# Patient Record
Sex: Female | Born: 1950
Health system: Southern US, Community
[De-identification: ages and names within clinical notes are randomized; demographics above are authoritative.]

## PROBLEM LIST (undated history)

## (undated) DIAGNOSIS — R0789 Other chest pain: Secondary | ICD-10-CM

## (undated) DIAGNOSIS — I10 Essential (primary) hypertension: Secondary | ICD-10-CM

## (undated) DIAGNOSIS — E785 Hyperlipidemia, unspecified: Secondary | ICD-10-CM

## (undated) DIAGNOSIS — R002 Palpitations: Secondary | ICD-10-CM

## (undated) DIAGNOSIS — Z6828 Body mass index (BMI) 28.0-28.9, adult: Secondary | ICD-10-CM

## (undated) DIAGNOSIS — R0989 Other specified symptoms and signs involving the circulatory and respiratory systems: Secondary | ICD-10-CM

## (undated) DIAGNOSIS — C50412 Malignant neoplasm of upper-outer quadrant of left female breast: Secondary | ICD-10-CM

## (undated) DIAGNOSIS — E041 Nontoxic single thyroid nodule: Secondary | ICD-10-CM

## (undated) DIAGNOSIS — J45909 Unspecified asthma, uncomplicated: Secondary | ICD-10-CM

## (undated) DIAGNOSIS — K760 Fatty (change of) liver, not elsewhere classified: Secondary | ICD-10-CM

## (undated) DIAGNOSIS — H9190 Unspecified hearing loss, unspecified ear: Secondary | ICD-10-CM

## (undated) HISTORY — DX: Hyperlipidemia, unspecified: E78.5

## (undated) HISTORY — DX: Other specified symptoms and signs involving the circulatory and respiratory systems: R09.89

## (undated) HISTORY — DX: Palpitations: R00.2

## (undated) HISTORY — DX: Nontoxic single thyroid nodule: E04.1

## (undated) HISTORY — DX: Unspecified asthma, uncomplicated: J45.909

## (undated) HISTORY — DX: Unspecified hearing loss, unspecified ear: H91.90

## (undated) HISTORY — DX: Malignant neoplasm of upper-outer quadrant of left female breast: C50.412

## (undated) HISTORY — PX: LEEP: SHX91

## (undated) HISTORY — DX: Essential (primary) hypertension: I10

## (undated) HISTORY — DX: Other chest pain: R07.89

## (undated) HISTORY — DX: Fatty (change of) liver, not elsewhere classified: K76.0

## (undated) HISTORY — DX: Body mass index (BMI) 28.0-28.9, adult: Z68.28

## (undated) HISTORY — PX: MASTECTOMY: SHX3

---

## 1977-11-23 HISTORY — PX: TUBAL LIGATION: SHX77

## 2013-11-23 HISTORY — PX: BREAST RECONSTRUCTION: SHX9

## 2013-12-21 DIAGNOSIS — C50412 Malignant neoplasm of upper-outer quadrant of left female breast: Secondary | ICD-10-CM | POA: Insufficient documentation

## 2016-04-02 DIAGNOSIS — C50912 Malignant neoplasm of unspecified site of left female breast: Secondary | ICD-10-CM | POA: Diagnosis not present

## 2016-12-04 DIAGNOSIS — C50912 Malignant neoplasm of unspecified site of left female breast: Secondary | ICD-10-CM | POA: Diagnosis not present

## 2016-12-04 DIAGNOSIS — Z9013 Acquired absence of bilateral breasts and nipples: Secondary | ICD-10-CM | POA: Diagnosis not present

## 2016-12-04 DIAGNOSIS — Z9221 Personal history of antineoplastic chemotherapy: Secondary | ICD-10-CM

## 2016-12-04 DIAGNOSIS — Z923 Personal history of irradiation: Secondary | ICD-10-CM

## 2016-12-04 DIAGNOSIS — Z170 Estrogen receptor positive status [ER+]: Secondary | ICD-10-CM | POA: Diagnosis not present

## 2016-12-04 DIAGNOSIS — Z79811 Long term (current) use of aromatase inhibitors: Secondary | ICD-10-CM | POA: Diagnosis not present

## 2016-12-04 DIAGNOSIS — M8580 Other specified disorders of bone density and structure, unspecified site: Secondary | ICD-10-CM

## 2017-10-22 DIAGNOSIS — Z79899 Other long term (current) drug therapy: Secondary | ICD-10-CM | POA: Diagnosis not present

## 2017-10-22 DIAGNOSIS — E785 Hyperlipidemia, unspecified: Secondary | ICD-10-CM | POA: Diagnosis not present

## 2017-10-22 DIAGNOSIS — Z1211 Encounter for screening for malignant neoplasm of colon: Secondary | ICD-10-CM | POA: Diagnosis not present

## 2017-10-22 DIAGNOSIS — E041 Nontoxic single thyroid nodule: Secondary | ICD-10-CM | POA: Diagnosis not present

## 2017-10-22 DIAGNOSIS — I10 Essential (primary) hypertension: Secondary | ICD-10-CM | POA: Diagnosis not present

## 2017-10-22 DIAGNOSIS — Z23 Encounter for immunization: Secondary | ICD-10-CM | POA: Diagnosis not present

## 2017-10-25 DIAGNOSIS — R300 Dysuria: Secondary | ICD-10-CM | POA: Diagnosis not present

## 2017-10-25 DIAGNOSIS — N3000 Acute cystitis without hematuria: Secondary | ICD-10-CM | POA: Diagnosis not present

## 2017-12-14 DIAGNOSIS — Z0000 Encounter for general adult medical examination without abnormal findings: Secondary | ICD-10-CM | POA: Diagnosis not present

## 2017-12-16 DIAGNOSIS — M8589 Other specified disorders of bone density and structure, multiple sites: Secondary | ICD-10-CM | POA: Diagnosis not present

## 2017-12-16 DIAGNOSIS — C50912 Malignant neoplasm of unspecified site of left female breast: Secondary | ICD-10-CM | POA: Diagnosis not present

## 2017-12-16 DIAGNOSIS — M8580 Other specified disorders of bone density and structure, unspecified site: Secondary | ICD-10-CM | POA: Diagnosis not present

## 2017-12-16 DIAGNOSIS — Z853 Personal history of malignant neoplasm of breast: Secondary | ICD-10-CM | POA: Diagnosis not present

## 2017-12-16 DIAGNOSIS — Z9221 Personal history of antineoplastic chemotherapy: Secondary | ICD-10-CM | POA: Diagnosis not present

## 2017-12-16 DIAGNOSIS — Z79811 Long term (current) use of aromatase inhibitors: Secondary | ICD-10-CM | POA: Diagnosis not present

## 2018-01-06 DIAGNOSIS — H25813 Combined forms of age-related cataract, bilateral: Secondary | ICD-10-CM | POA: Diagnosis not present

## 2018-01-06 DIAGNOSIS — H35033 Hypertensive retinopathy, bilateral: Secondary | ICD-10-CM | POA: Diagnosis not present

## 2018-04-19 DIAGNOSIS — M25552 Pain in left hip: Secondary | ICD-10-CM | POA: Diagnosis not present

## 2018-04-19 DIAGNOSIS — M25562 Pain in left knee: Secondary | ICD-10-CM | POA: Diagnosis not present

## 2018-04-19 DIAGNOSIS — Z6828 Body mass index (BMI) 28.0-28.9, adult: Secondary | ICD-10-CM | POA: Diagnosis not present

## 2018-04-22 DIAGNOSIS — M179 Osteoarthritis of knee, unspecified: Secondary | ICD-10-CM | POA: Diagnosis not present

## 2018-04-22 DIAGNOSIS — M1712 Unilateral primary osteoarthritis, left knee: Secondary | ICD-10-CM | POA: Diagnosis not present

## 2018-04-22 DIAGNOSIS — M25562 Pain in left knee: Secondary | ICD-10-CM | POA: Diagnosis not present

## 2018-04-22 DIAGNOSIS — Z853 Personal history of malignant neoplasm of breast: Secondary | ICD-10-CM | POA: Diagnosis not present

## 2018-05-11 DIAGNOSIS — M1612 Unilateral primary osteoarthritis, left hip: Secondary | ICD-10-CM | POA: Diagnosis not present

## 2018-05-11 DIAGNOSIS — M179 Osteoarthritis of knee, unspecified: Secondary | ICD-10-CM

## 2018-05-11 DIAGNOSIS — M25562 Pain in left knee: Secondary | ICD-10-CM | POA: Diagnosis not present

## 2018-05-11 DIAGNOSIS — M1712 Unilateral primary osteoarthritis, left knee: Secondary | ICD-10-CM | POA: Diagnosis not present

## 2018-05-11 HISTORY — DX: Osteoarthritis of knee, unspecified: M17.9

## 2018-05-31 DIAGNOSIS — M7062 Trochanteric bursitis, left hip: Secondary | ICD-10-CM | POA: Insufficient documentation

## 2018-05-31 DIAGNOSIS — M25562 Pain in left knee: Secondary | ICD-10-CM | POA: Diagnosis not present

## 2018-05-31 DIAGNOSIS — M179 Osteoarthritis of knee, unspecified: Secondary | ICD-10-CM | POA: Diagnosis not present

## 2018-05-31 HISTORY — DX: Trochanteric bursitis, left hip: M70.62

## 2018-06-15 DIAGNOSIS — Z79811 Long term (current) use of aromatase inhibitors: Secondary | ICD-10-CM | POA: Diagnosis not present

## 2018-06-15 DIAGNOSIS — Z853 Personal history of malignant neoplasm of breast: Secondary | ICD-10-CM | POA: Diagnosis not present

## 2018-06-15 DIAGNOSIS — M810 Age-related osteoporosis without current pathological fracture: Secondary | ICD-10-CM | POA: Diagnosis not present

## 2018-07-08 DIAGNOSIS — M6281 Muscle weakness (generalized): Secondary | ICD-10-CM | POA: Diagnosis not present

## 2018-07-08 DIAGNOSIS — M7062 Trochanteric bursitis, left hip: Secondary | ICD-10-CM | POA: Diagnosis not present

## 2018-07-08 DIAGNOSIS — R2689 Other abnormalities of gait and mobility: Secondary | ICD-10-CM | POA: Diagnosis not present

## 2018-07-08 DIAGNOSIS — M79652 Pain in left thigh: Secondary | ICD-10-CM | POA: Diagnosis not present

## 2018-07-18 DIAGNOSIS — M79652 Pain in left thigh: Secondary | ICD-10-CM | POA: Diagnosis not present

## 2018-07-18 DIAGNOSIS — R2689 Other abnormalities of gait and mobility: Secondary | ICD-10-CM | POA: Diagnosis not present

## 2018-07-18 DIAGNOSIS — M6281 Muscle weakness (generalized): Secondary | ICD-10-CM | POA: Diagnosis not present

## 2018-07-18 DIAGNOSIS — M7062 Trochanteric bursitis, left hip: Secondary | ICD-10-CM | POA: Diagnosis not present

## 2018-07-20 DIAGNOSIS — M7062 Trochanteric bursitis, left hip: Secondary | ICD-10-CM | POA: Diagnosis not present

## 2018-07-20 DIAGNOSIS — M6281 Muscle weakness (generalized): Secondary | ICD-10-CM | POA: Diagnosis not present

## 2018-07-20 DIAGNOSIS — R2689 Other abnormalities of gait and mobility: Secondary | ICD-10-CM | POA: Diagnosis not present

## 2018-07-20 DIAGNOSIS — M79652 Pain in left thigh: Secondary | ICD-10-CM | POA: Diagnosis not present

## 2018-07-26 DIAGNOSIS — M7062 Trochanteric bursitis, left hip: Secondary | ICD-10-CM | POA: Diagnosis not present

## 2018-07-26 DIAGNOSIS — R2689 Other abnormalities of gait and mobility: Secondary | ICD-10-CM | POA: Diagnosis not present

## 2018-07-26 DIAGNOSIS — M6281 Muscle weakness (generalized): Secondary | ICD-10-CM | POA: Diagnosis not present

## 2018-07-26 DIAGNOSIS — M79652 Pain in left thigh: Secondary | ICD-10-CM | POA: Diagnosis not present

## 2018-07-28 DIAGNOSIS — R2689 Other abnormalities of gait and mobility: Secondary | ICD-10-CM | POA: Diagnosis not present

## 2018-07-28 DIAGNOSIS — M6281 Muscle weakness (generalized): Secondary | ICD-10-CM | POA: Diagnosis not present

## 2018-07-28 DIAGNOSIS — M7062 Trochanteric bursitis, left hip: Secondary | ICD-10-CM | POA: Diagnosis not present

## 2018-07-28 DIAGNOSIS — M79652 Pain in left thigh: Secondary | ICD-10-CM | POA: Diagnosis not present

## 2018-08-03 DIAGNOSIS — M79652 Pain in left thigh: Secondary | ICD-10-CM | POA: Diagnosis not present

## 2018-08-03 DIAGNOSIS — M6281 Muscle weakness (generalized): Secondary | ICD-10-CM | POA: Diagnosis not present

## 2018-08-03 DIAGNOSIS — M7062 Trochanteric bursitis, left hip: Secondary | ICD-10-CM | POA: Diagnosis not present

## 2018-08-03 DIAGNOSIS — R2689 Other abnormalities of gait and mobility: Secondary | ICD-10-CM | POA: Diagnosis not present

## 2018-08-05 DIAGNOSIS — M79652 Pain in left thigh: Secondary | ICD-10-CM | POA: Diagnosis not present

## 2018-08-05 DIAGNOSIS — M7062 Trochanteric bursitis, left hip: Secondary | ICD-10-CM | POA: Diagnosis not present

## 2018-08-05 DIAGNOSIS — R2689 Other abnormalities of gait and mobility: Secondary | ICD-10-CM | POA: Diagnosis not present

## 2018-08-05 DIAGNOSIS — M6281 Muscle weakness (generalized): Secondary | ICD-10-CM | POA: Diagnosis not present

## 2018-08-08 DIAGNOSIS — M6281 Muscle weakness (generalized): Secondary | ICD-10-CM | POA: Diagnosis not present

## 2018-08-08 DIAGNOSIS — M79652 Pain in left thigh: Secondary | ICD-10-CM | POA: Diagnosis not present

## 2018-08-08 DIAGNOSIS — R2689 Other abnormalities of gait and mobility: Secondary | ICD-10-CM | POA: Diagnosis not present

## 2018-08-08 DIAGNOSIS — M7062 Trochanteric bursitis, left hip: Secondary | ICD-10-CM | POA: Diagnosis not present

## 2018-08-10 DIAGNOSIS — M79652 Pain in left thigh: Secondary | ICD-10-CM | POA: Diagnosis not present

## 2018-08-10 DIAGNOSIS — M7062 Trochanteric bursitis, left hip: Secondary | ICD-10-CM | POA: Diagnosis not present

## 2018-08-10 DIAGNOSIS — M6281 Muscle weakness (generalized): Secondary | ICD-10-CM | POA: Diagnosis not present

## 2018-08-10 DIAGNOSIS — R2689 Other abnormalities of gait and mobility: Secondary | ICD-10-CM | POA: Diagnosis not present

## 2018-08-15 DIAGNOSIS — M7062 Trochanteric bursitis, left hip: Secondary | ICD-10-CM | POA: Diagnosis not present

## 2018-08-15 DIAGNOSIS — M6281 Muscle weakness (generalized): Secondary | ICD-10-CM | POA: Diagnosis not present

## 2018-08-15 DIAGNOSIS — R2689 Other abnormalities of gait and mobility: Secondary | ICD-10-CM | POA: Diagnosis not present

## 2018-08-15 DIAGNOSIS — M79652 Pain in left thigh: Secondary | ICD-10-CM | POA: Diagnosis not present

## 2018-08-17 DIAGNOSIS — R2689 Other abnormalities of gait and mobility: Secondary | ICD-10-CM | POA: Diagnosis not present

## 2018-08-17 DIAGNOSIS — M7062 Trochanteric bursitis, left hip: Secondary | ICD-10-CM | POA: Diagnosis not present

## 2018-08-17 DIAGNOSIS — M6281 Muscle weakness (generalized): Secondary | ICD-10-CM | POA: Diagnosis not present

## 2018-08-17 DIAGNOSIS — M79652 Pain in left thigh: Secondary | ICD-10-CM | POA: Diagnosis not present

## 2018-08-22 DIAGNOSIS — M6281 Muscle weakness (generalized): Secondary | ICD-10-CM | POA: Diagnosis not present

## 2018-08-22 DIAGNOSIS — M7062 Trochanteric bursitis, left hip: Secondary | ICD-10-CM | POA: Diagnosis not present

## 2018-08-22 DIAGNOSIS — R2689 Other abnormalities of gait and mobility: Secondary | ICD-10-CM | POA: Diagnosis not present

## 2018-08-22 DIAGNOSIS — M79652 Pain in left thigh: Secondary | ICD-10-CM | POA: Diagnosis not present

## 2018-09-05 DIAGNOSIS — M8589 Other specified disorders of bone density and structure, multiple sites: Secondary | ICD-10-CM | POA: Diagnosis not present

## 2018-09-05 DIAGNOSIS — M85852 Other specified disorders of bone density and structure, left thigh: Secondary | ICD-10-CM | POA: Diagnosis not present

## 2018-09-05 DIAGNOSIS — M85851 Other specified disorders of bone density and structure, right thigh: Secondary | ICD-10-CM | POA: Diagnosis not present

## 2018-10-24 DIAGNOSIS — Z6828 Body mass index (BMI) 28.0-28.9, adult: Secondary | ICD-10-CM | POA: Diagnosis not present

## 2018-10-24 DIAGNOSIS — Z79899 Other long term (current) drug therapy: Secondary | ICD-10-CM | POA: Diagnosis not present

## 2018-10-24 DIAGNOSIS — Z23 Encounter for immunization: Secondary | ICD-10-CM | POA: Diagnosis not present

## 2018-10-24 DIAGNOSIS — I10 Essential (primary) hypertension: Secondary | ICD-10-CM | POA: Diagnosis not present

## 2018-10-24 DIAGNOSIS — E785 Hyperlipidemia, unspecified: Secondary | ICD-10-CM | POA: Diagnosis not present

## 2018-12-02 DIAGNOSIS — J209 Acute bronchitis, unspecified: Secondary | ICD-10-CM | POA: Diagnosis not present

## 2018-12-07 DIAGNOSIS — J40 Bronchitis, not specified as acute or chronic: Secondary | ICD-10-CM | POA: Diagnosis not present

## 2018-12-12 DIAGNOSIS — M8580 Other specified disorders of bone density and structure, unspecified site: Secondary | ICD-10-CM | POA: Diagnosis not present

## 2018-12-12 DIAGNOSIS — Z853 Personal history of malignant neoplasm of breast: Secondary | ICD-10-CM | POA: Diagnosis not present

## 2018-12-12 DIAGNOSIS — C50912 Malignant neoplasm of unspecified site of left female breast: Secondary | ICD-10-CM | POA: Diagnosis not present

## 2018-12-12 DIAGNOSIS — Z79811 Long term (current) use of aromatase inhibitors: Secondary | ICD-10-CM | POA: Diagnosis not present

## 2018-12-12 DIAGNOSIS — N6320 Unspecified lump in the left breast, unspecified quadrant: Secondary | ICD-10-CM | POA: Diagnosis not present

## 2018-12-16 DIAGNOSIS — M8589 Other specified disorders of bone density and structure, multiple sites: Secondary | ICD-10-CM | POA: Diagnosis not present

## 2018-12-23 DIAGNOSIS — N649 Disorder of breast, unspecified: Secondary | ICD-10-CM | POA: Diagnosis not present

## 2018-12-23 DIAGNOSIS — Z853 Personal history of malignant neoplasm of breast: Secondary | ICD-10-CM | POA: Diagnosis not present

## 2018-12-23 DIAGNOSIS — R928 Other abnormal and inconclusive findings on diagnostic imaging of breast: Secondary | ICD-10-CM | POA: Diagnosis not present

## 2018-12-23 DIAGNOSIS — N6489 Other specified disorders of breast: Secondary | ICD-10-CM | POA: Diagnosis not present

## 2019-01-02 DIAGNOSIS — Z6828 Body mass index (BMI) 28.0-28.9, adult: Secondary | ICD-10-CM | POA: Diagnosis not present

## 2019-01-02 DIAGNOSIS — J329 Chronic sinusitis, unspecified: Secondary | ICD-10-CM | POA: Diagnosis not present

## 2019-01-02 DIAGNOSIS — J40 Bronchitis, not specified as acute or chronic: Secondary | ICD-10-CM | POA: Diagnosis not present

## 2019-06-07 DIAGNOSIS — M8589 Other specified disorders of bone density and structure, multiple sites: Secondary | ICD-10-CM | POA: Diagnosis not present

## 2019-06-07 DIAGNOSIS — Z853 Personal history of malignant neoplasm of breast: Secondary | ICD-10-CM | POA: Diagnosis not present

## 2019-06-07 DIAGNOSIS — Z79811 Long term (current) use of aromatase inhibitors: Secondary | ICD-10-CM | POA: Diagnosis not present

## 2019-06-23 ENCOUNTER — Other Ambulatory Visit: Payer: Self-pay

## 2019-08-02 DIAGNOSIS — H9190 Unspecified hearing loss, unspecified ear: Secondary | ICD-10-CM | POA: Diagnosis not present

## 2019-08-02 DIAGNOSIS — Z23 Encounter for immunization: Secondary | ICD-10-CM | POA: Diagnosis not present

## 2019-08-02 DIAGNOSIS — Z6828 Body mass index (BMI) 28.0-28.9, adult: Secondary | ICD-10-CM | POA: Diagnosis not present

## 2019-08-24 DIAGNOSIS — Z77122 Contact with and (suspected) exposure to noise: Secondary | ICD-10-CM | POA: Diagnosis not present

## 2019-08-24 DIAGNOSIS — H9190 Unspecified hearing loss, unspecified ear: Secondary | ICD-10-CM | POA: Diagnosis not present

## 2019-08-24 DIAGNOSIS — H938X9 Other specified disorders of ear, unspecified ear: Secondary | ICD-10-CM | POA: Diagnosis not present

## 2019-08-24 DIAGNOSIS — H903 Sensorineural hearing loss, bilateral: Secondary | ICD-10-CM | POA: Diagnosis not present

## 2019-09-06 DIAGNOSIS — Z6828 Body mass index (BMI) 28.0-28.9, adult: Secondary | ICD-10-CM | POA: Diagnosis not present

## 2019-09-06 DIAGNOSIS — E785 Hyperlipidemia, unspecified: Secondary | ICD-10-CM | POA: Diagnosis not present

## 2019-09-06 DIAGNOSIS — Z79899 Other long term (current) drug therapy: Secondary | ICD-10-CM | POA: Diagnosis not present

## 2019-09-06 DIAGNOSIS — Z0000 Encounter for general adult medical examination without abnormal findings: Secondary | ICD-10-CM | POA: Diagnosis not present

## 2019-09-06 DIAGNOSIS — Z1331 Encounter for screening for depression: Secondary | ICD-10-CM | POA: Diagnosis not present

## 2019-09-27 DIAGNOSIS — H903 Sensorineural hearing loss, bilateral: Secondary | ICD-10-CM | POA: Diagnosis not present

## 2019-10-05 DIAGNOSIS — H903 Sensorineural hearing loss, bilateral: Secondary | ICD-10-CM | POA: Diagnosis not present

## 2019-11-29 DIAGNOSIS — R300 Dysuria: Secondary | ICD-10-CM | POA: Diagnosis not present

## 2019-12-04 DIAGNOSIS — M8589 Other specified disorders of bone density and structure, multiple sites: Secondary | ICD-10-CM | POA: Diagnosis not present

## 2019-12-04 DIAGNOSIS — Z853 Personal history of malignant neoplasm of breast: Secondary | ICD-10-CM | POA: Diagnosis not present

## 2019-12-04 DIAGNOSIS — C50412 Malignant neoplasm of upper-outer quadrant of left female breast: Secondary | ICD-10-CM

## 2019-12-05 DIAGNOSIS — R200 Anesthesia of skin: Secondary | ICD-10-CM | POA: Diagnosis not present

## 2020-03-27 DIAGNOSIS — R300 Dysuria: Secondary | ICD-10-CM | POA: Diagnosis not present

## 2020-03-27 DIAGNOSIS — M5432 Sciatica, left side: Secondary | ICD-10-CM | POA: Diagnosis not present

## 2020-03-27 DIAGNOSIS — Z6828 Body mass index (BMI) 28.0-28.9, adult: Secondary | ICD-10-CM | POA: Diagnosis not present

## 2020-04-29 DIAGNOSIS — H9193 Unspecified hearing loss, bilateral: Secondary | ICD-10-CM | POA: Diagnosis not present

## 2020-04-29 DIAGNOSIS — Z974 Presence of external hearing-aid: Secondary | ICD-10-CM | POA: Diagnosis not present

## 2020-04-29 DIAGNOSIS — S00411A Abrasion of right ear, initial encounter: Secondary | ICD-10-CM | POA: Diagnosis not present

## 2020-05-24 DIAGNOSIS — R001 Bradycardia, unspecified: Secondary | ICD-10-CM | POA: Diagnosis not present

## 2020-05-24 DIAGNOSIS — Z853 Personal history of malignant neoplasm of breast: Secondary | ICD-10-CM | POA: Diagnosis not present

## 2020-05-24 DIAGNOSIS — M8589 Other specified disorders of bone density and structure, multiple sites: Secondary | ICD-10-CM | POA: Diagnosis not present

## 2020-05-24 DIAGNOSIS — I10 Essential (primary) hypertension: Secondary | ICD-10-CM | POA: Diagnosis not present

## 2020-05-30 DIAGNOSIS — H25813 Combined forms of age-related cataract, bilateral: Secondary | ICD-10-CM | POA: Diagnosis not present

## 2020-05-30 DIAGNOSIS — H35033 Hypertensive retinopathy, bilateral: Secondary | ICD-10-CM | POA: Diagnosis not present

## 2020-09-02 ENCOUNTER — Encounter: Payer: Self-pay | Admitting: Hematology and Oncology

## 2020-09-06 DIAGNOSIS — M8589 Other specified disorders of bone density and structure, multiple sites: Secondary | ICD-10-CM | POA: Diagnosis not present

## 2020-09-09 DIAGNOSIS — E041 Nontoxic single thyroid nodule: Secondary | ICD-10-CM | POA: Diagnosis not present

## 2020-09-09 DIAGNOSIS — Z79899 Other long term (current) drug therapy: Secondary | ICD-10-CM | POA: Diagnosis not present

## 2020-09-09 DIAGNOSIS — Z23 Encounter for immunization: Secondary | ICD-10-CM | POA: Diagnosis not present

## 2020-09-09 DIAGNOSIS — E785 Hyperlipidemia, unspecified: Secondary | ICD-10-CM | POA: Diagnosis not present

## 2020-09-09 DIAGNOSIS — I10 Essential (primary) hypertension: Secondary | ICD-10-CM | POA: Diagnosis not present

## 2020-09-09 DIAGNOSIS — Z6828 Body mass index (BMI) 28.0-28.9, adult: Secondary | ICD-10-CM | POA: Diagnosis not present

## 2020-10-29 ENCOUNTER — Other Ambulatory Visit: Payer: Self-pay | Admitting: Hematology and Oncology

## 2020-10-29 DIAGNOSIS — Z170 Estrogen receptor positive status [ER+]: Secondary | ICD-10-CM

## 2020-10-29 DIAGNOSIS — C50412 Malignant neoplasm of upper-outer quadrant of left female breast: Secondary | ICD-10-CM

## 2020-11-01 ENCOUNTER — Other Ambulatory Visit: Payer: Self-pay | Admitting: Pharmacist

## 2020-11-01 ENCOUNTER — Encounter: Payer: Self-pay | Admitting: Pharmacist

## 2020-11-01 DIAGNOSIS — M8589 Other specified disorders of bone density and structure, multiple sites: Secondary | ICD-10-CM

## 2020-11-01 HISTORY — DX: Other specified disorders of bone density and structure, multiple sites: M85.89

## 2020-11-05 NOTE — Progress Notes (Signed)
PT STABLE AT TIME OF DISCHARGE 

## 2020-11-12 ENCOUNTER — Other Ambulatory Visit: Payer: Self-pay | Admitting: Hematology and Oncology

## 2020-11-12 ENCOUNTER — Inpatient Hospital Stay: Payer: Medicare Other | Attending: Oncology

## 2020-11-12 ENCOUNTER — Other Ambulatory Visit: Payer: Self-pay

## 2020-11-12 ENCOUNTER — Telehealth: Payer: Self-pay | Admitting: Oncology

## 2020-11-12 DIAGNOSIS — D649 Anemia, unspecified: Secondary | ICD-10-CM | POA: Diagnosis not present

## 2020-11-12 DIAGNOSIS — Z0001 Encounter for general adult medical examination with abnormal findings: Secondary | ICD-10-CM | POA: Diagnosis not present

## 2020-11-12 LAB — COMPREHENSIVE METABOLIC PANEL
Albumin: 4.4 (ref 3.5–5.0)
Calcium: 10.1 (ref 8.7–10.7)

## 2020-11-12 LAB — CBC AND DIFFERENTIAL
HCT: 41 (ref 36–46)
Hemoglobin: 13.8 (ref 12.0–16.0)
Neutrophils Absolute: 3.67
Platelets: 265 (ref 150–399)
WBC: 6.8

## 2020-11-12 LAB — HEPATIC FUNCTION PANEL
ALT: 20 (ref 7–35)
AST: 25 (ref 13–35)
Alkaline Phosphatase: 66 (ref 25–125)
Bilirubin, Total: 0.5

## 2020-11-12 LAB — BASIC METABOLIC PANEL
BUN: 17 (ref 4–21)
CO2: 28 — AB (ref 13–22)
Chloride: 105 (ref 99–108)
Creatinine: 0.7 (ref 0.5–1.1)
Glucose: 95
Potassium: 4 (ref 3.4–5.3)
Sodium: 139 (ref 137–147)

## 2020-11-12 LAB — CBC: RBC: 4.67 (ref 3.87–5.11)

## 2020-11-12 NOTE — Telephone Encounter (Signed)
Patient requested to be rescheduled for Follow Up and Prolia Injection on 11/26/2020 Follow Up at 11:00 am - Prolia Injection 1:00 pm

## 2020-11-14 ENCOUNTER — Inpatient Hospital Stay: Payer: Medicare Other

## 2020-11-18 ENCOUNTER — Ambulatory Visit: Payer: Medicare Other | Admitting: Oncology

## 2020-11-18 ENCOUNTER — Other Ambulatory Visit: Payer: Medicare Other

## 2020-11-25 NOTE — Progress Notes (Signed)
Sarah Hubbard  [address] [place],  [place]  [address] [phone]  Clinic Day:  11/26/2020  Referring physician: Ronita Hipps, MD   This document serves as a record of services personally performed by Sarah Poisson, MD. It was created on their behalf by [facility], a trained medical scribe. The creation of this record is based on the scribe's personal observations and the provider's statements to them.   CHIEF COMPLAINT:  CC: History of stage IIB hormone receptor positive breast cancer  Current Treatment:  Surveillance    HISTORY OF PRESENT ILLNESS:  Sarah Hubbard is a 70 y.o. 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 We began seeing her in March 2016, when she relocated to the area.  She was treated with bilateral mastectomies and reconstruction in [place].  Pathology revealed 2 lesions in the left breast measuring 2.5 cm and 1.1 cm respectively, which were grade 2, invasive lobular carcinoma. 1 of 4 lymph nodes were positive.  Pathology of the right breast was benign.  Estrogen receptors were positive, progesterone receptors negative and her 2 Neu negative.  She had surgical wound dehiscence of the reconstruction and abdominal wounds, but eventually healed.  She received adjuvant chemotherapy with dose dense doxorubicin and cyclophosphamide for 4 cycles, followed by dose dense paclitaxel for 4 cycles.  Chemotherapy was completed in August 2015.  She received adjuvant radiation to the left reconstruction and axilla completed in October 2015.  She was then placed on anastrozole 1 mg daily in November 2015.  Due to her personal and family history she states she saw a genetic counselor regarding testing for hereditary breast and ovarian cancer.  Her mother had breast cancer in her 56's and a maternal grandmother had lung cancer.  As the patient's daughter had already  undergone testing for hereditary breast and ovarian cancer, which was negative, the patient did not wish to have testing.  Bone density scan was in October 2017 and revealed osteopenia, with a T-score of -1.8 of the femur and -0.7 of the spine.  The bone density had previously been normal with a T-score of -0.6 of the femur.  Due to the osteopenia while on anastrozole, she was instructed to take calcium/vitamin-D twice daily and placed on Prolia injections every 6 months in January 2018.  She had removal of polyps on previous colonoscopy, so Dr. Helene Kelp had her see Dr. Melina Copa.  He recommended repeat colonoscopy in 5 years.  Bone density scan in October 2019 revealed improvement in her osteopenia with a T-score of -0.9 in the spine and a T-score -1.6 in the femur.  At her visit in January 2020, she was concerned about a tender lump under her left breast reconstruction.  There was some firmness of the inferior left reconstruction.  She therefore underwent a left diagnostic mammogram and ultrasound, which did not reveal any abnormality.  She stopped her anastrozole in January 2021.    INTERVAL HISTORY:  Sarah Hubbard is here for routine follow up and states that she has been well.  Bone density scan from October revealed osteopenia with a T-score of -1.6 of the right femur neck, stable.  Dual femur total mean is normal at -1.0, previously -1.1.  AP spine is normal at -0.5, previously -0.9.  With this good of improvement, we will discontinue Prolia after her dose today.  I did advise that she continue with oral calcium and  vitamin D.  Blood counts and chemistries from December 21st were unremarkable.  Her  appetite is good, and she has gained 2 pounds since her last visit.  She denies fever, chills or other signs of infection.  She denies nausea, vomiting, bowel issues, or abdominal pain.  She denies sore throat, cough, dyspnea, or chest pain.  REVIEW OF SYSTEMS:  Review of Systems  Constitutional: Negative.   HENT:   Negative.   Eyes: Negative.   Respiratory: Negative.   Cardiovascular: Negative.   Gastrointestinal: Negative.   Endocrine: Negative.   Genitourinary: Negative.    Musculoskeletal: Negative.   Skin: Negative.   Neurological: Negative.   Hematological: Negative.   Psychiatric/Behavioral: Negative.      VITALS:  Blood pressure (!) 152/71, pulse (!) 52, temperature (!) 97.4 F (36.3 C), temperature source Oral, resp. rate 18, height 5' (1.524 m), weight 158 lb 11.2 oz (72 kg), SpO2 98 %.  Wt Readings from Last 3 Encounters:  11/26/20 158 lb 11.2 oz (72 kg)  05/24/20 156 lb 9 oz (71 kg)    Body mass index is 30.99 kg/m.  Performance status (ECOG): 1 - Symptomatic but completely ambulatory  PHYSICAL EXAM:  Physical Exam Constitutional:      General: She is not in acute distress.    Appearance: Normal appearance. She is normal weight.  HENT:     Head: Normocephalic and atraumatic.  Eyes:     General: No scleral icterus.    Extraocular Movements: Extraocular movements intact.     Conjunctiva/sclera: Conjunctivae normal.     Pupils: Pupils are equal, round, and reactive to light.  Cardiovascular:     Rate and Rhythm: Regular rhythm. Bradycardia present.     Pulses: Normal pulses.     Heart sounds: Normal heart sounds. No murmur heard. No friction rub. No gallop.   Pulmonary:     Effort: Pulmonary effort is normal. No respiratory distress.     Breath sounds: Normal breath sounds.  Chest:  Breasts:     Right: Normal.     Left: Normal.      Comments: Bilateral reconstructions are negative. Abdominal:     General: Bowel sounds are normal. There is no distension.     Palpations: Abdomen is soft. There is no mass.     Tenderness: There is no abdominal tenderness.  Musculoskeletal:        General: Normal range of motion.     Cervical back: Normal range of motion and neck supple.     Right lower leg: No edema.     Left lower leg: No edema.  Lymphadenopathy:     Cervical:  No cervical adenopathy.  Skin:    General: Skin is warm and dry.  Neurological:     General: No focal deficit present.     Mental Status: She is alert and oriented to person, place, and time. Mental status is at baseline.  Psychiatric:        Mood and Affect: Mood normal.        Behavior: Behavior normal.        Thought Content: Thought content normal.        Judgment: Judgment normal.     LABS:   CBC Latest Ref Rng & Units 11/12/2020  WBC - 6.8  Hemoglobin 12.0 - 16.0 13.8  Hematocrit 36 - 46 41  Platelets 150 - 399 265   CMP Latest Ref Rng & Units 11/12/2020  BUN 4 - 21 17  Creatinine  0.5 - 1.1 0.7  Sodium 137 - 147 139  Potassium 3.4 - 5.3 4.0  Chloride 99 - 108 105  CO2 13 - 22 28(A)  Calcium 8.7 - 10.7 10.1  Alkaline Phos 25 - 125 66  AST 13 - 35 25  ALT 7 - 35 20    STUDIES:   She underwent a DXA for bone mineral density on 09/06/2020 showing osteopenia with a T-score of -1.6 of the right femur neck, stable.  Dual femur total mean is normal at -1.0, previously -1.1.  AP spine is normal at -0.5, previously -0.9.    Allergies:  Allergies  Allergen Reactions  . Antihistamines, Diphenhydramine-Type     Current Medications: Current Outpatient Medications  Medication Sig Dispense Refill  . lisinopril (ZESTRIL) 5 MG tablet     . aspirin EC 81 MG tablet Take 81 mg by mouth daily. Swallow whole.    . calcium-vitamin D (OSCAL WITH D) 500-200 MG-UNIT tablet Take 1 tablet by mouth.    . denosumab (PROLIA) 60 MG/ML SOSY injection Inject 60 mg into the skin every 6 (six) months.    Marland Kitchen losartan (COZAAR) 25 MG tablet Take 25 mg by mouth daily.    . Omega-3 1000 MG CAPS Take by mouth.    . omega-3 acid ethyl esters (LOVAZA) 1 g capsule Take by mouth 2 (two) times daily.    . potassium chloride (KLOR-CON) 8 MEQ tablet Take 8 mEq by mouth daily.    . pravastatin (PRAVACHOL) 10 MG tablet Take 10 mg by mouth daily.     No current facility-administered medications for this  visit.     ASSESSMENT & PLAN:   Assessment:   1. History of IIB hormone receptor positive breast cancer.  She remains without evidence of recurrence.  She completed 5+ years of anastrozole in January 2021.  2. Osteopenia, for which she is on Prolia every 6 months, in addition to calcium and vitamin-D.  Recent bone density shows good improvement so we will discontinue after her dose today.  She knows to continue with daily calcium and vitamin D.  She will be due for repeat bone density in October 99991111.  3. Systolic hypertension, mild.    4. Mild stable bradycardia.  The patient is asymptomatic.   Plan: We will proceed with Prolia today then discontinue this as most recent bone density shows good improvement and she in now off the aromatase inhibitor.  She knows to continue oral calcium and vitamin D.  We will plan to see her back in 1 year for repeat evaluation.  We will ask Dr. Helene Kelp to include a CBC and CMP with her annual blood work, and we will send a copy of the patients bone density to her office.  The patient understands the plans discussed today and is in agreement with them.  She knows to contact our office if she develops concerns regarding her breast cancer or its treatment.   I provided 20 minutes of face-to-face time during this this encounter and > 50% was spent counseling as documented under my assessment and plan.    Derwood Kaplan, MD [facility] AT [facility] [address] [place] [address] Dept: [phone] Dept Fax: [phone]   I, Rita Ohara, am acting as scribe for Derwood Kaplan, MD  I have reviewed this report as typed by the medical scribe, and it is complete and accurate.

## 2020-11-26 ENCOUNTER — Other Ambulatory Visit: Payer: Self-pay

## 2020-11-26 ENCOUNTER — Ambulatory Visit: Payer: Medicare Other

## 2020-11-26 ENCOUNTER — Other Ambulatory Visit: Payer: Self-pay | Admitting: Pharmacist

## 2020-11-26 ENCOUNTER — Inpatient Hospital Stay: Payer: Medicare Other

## 2020-11-26 ENCOUNTER — Encounter: Payer: Self-pay | Admitting: Oncology

## 2020-11-26 ENCOUNTER — Inpatient Hospital Stay: Payer: Medicare Other | Attending: Oncology | Admitting: Oncology

## 2020-11-26 VITALS — BP 146/58 | HR 58 | Temp 97.9°F | Resp 18 | Ht 60.0 in | Wt 160.2 lb

## 2020-11-26 VITALS — BP 152/71 | HR 52 | Temp 97.4°F | Resp 18 | Ht 60.0 in | Wt 158.7 lb

## 2020-11-26 DIAGNOSIS — Z923 Personal history of irradiation: Secondary | ICD-10-CM | POA: Insufficient documentation

## 2020-11-26 DIAGNOSIS — R001 Bradycardia, unspecified: Secondary | ICD-10-CM | POA: Insufficient documentation

## 2020-11-26 DIAGNOSIS — C50412 Malignant neoplasm of upper-outer quadrant of left female breast: Secondary | ICD-10-CM | POA: Diagnosis not present

## 2020-11-26 DIAGNOSIS — Z79899 Other long term (current) drug therapy: Secondary | ICD-10-CM | POA: Diagnosis not present

## 2020-11-26 DIAGNOSIS — Z7983 Long term (current) use of bisphosphonates: Secondary | ICD-10-CM | POA: Diagnosis not present

## 2020-11-26 DIAGNOSIS — Z170 Estrogen receptor positive status [ER+]: Secondary | ICD-10-CM | POA: Diagnosis not present

## 2020-11-26 DIAGNOSIS — M8589 Other specified disorders of bone density and structure, multiple sites: Secondary | ICD-10-CM

## 2020-11-26 DIAGNOSIS — C50912 Malignant neoplasm of unspecified site of left female breast: Secondary | ICD-10-CM | POA: Diagnosis not present

## 2020-11-26 DIAGNOSIS — M85851 Other specified disorders of bone density and structure, right thigh: Secondary | ICD-10-CM | POA: Diagnosis not present

## 2020-11-26 DIAGNOSIS — Z79811 Long term (current) use of aromatase inhibitors: Secondary | ICD-10-CM | POA: Insufficient documentation

## 2020-11-26 DIAGNOSIS — I10 Essential (primary) hypertension: Secondary | ICD-10-CM | POA: Diagnosis not present

## 2020-11-26 MED ORDER — DENOSUMAB 60 MG/ML ~~LOC~~ SOSY
60.0000 mg | PREFILLED_SYRINGE | Freq: Once | SUBCUTANEOUS | Status: AC
  Administered 2020-11-26: 60 mg via SUBCUTANEOUS

## 2020-11-26 MED ORDER — DENOSUMAB 60 MG/ML ~~LOC~~ SOSY
PREFILLED_SYRINGE | SUBCUTANEOUS | Status: AC
  Filled 2020-11-26: qty 1

## 2020-11-26 NOTE — Patient Instructions (Signed)
Denosumab injection What is this medicine? DENOSUMAB (den oh sue mab) slows bone breakdown. Prolia is used to treat osteoporosis in women after menopause and in men, and in people who are taking corticosteroids for 6 months or more. Xgeva is used to treat a high calcium level due to cancer and to prevent bone fractures and other bone problems caused by multiple myeloma or cancer bone metastases. Xgeva is also used to treat giant cell tumor of the bone. This medicine may be used for other purposes; ask your health care provider or pharmacist if you have questions. COMMON BRAND NAME(S): Prolia, XGEVA What should I tell my health care provider before I take this medicine? They need to know if you have any of these conditions:  dental disease  having surgery or tooth extraction  infection  kidney disease  low levels of calcium or Vitamin D in the blood  malnutrition  on hemodialysis  skin conditions or sensitivity  thyroid or parathyroid disease  an unusual reaction to denosumab, other medicines, foods, dyes, or preservatives  pregnant or trying to get pregnant  breast-feeding How should I use this medicine? This medicine is for injection under the skin. It is given by a health care professional in a hospital or clinic setting. A special MedGuide will be given to you before each treatment. Be sure to read this information carefully each time. For Prolia, talk to your pediatrician regarding the use of this medicine in children. Special care may be needed. For Xgeva, talk to your pediatrician regarding the use of this medicine in children. While this drug may be prescribed for children as young as 13 years for selected conditions, precautions do apply. Overdosage: If you think you have taken too much of this medicine contact a poison control center or emergency room at once. NOTE: This medicine is only for you. Do not share this medicine with others. What if I miss a dose? It is  important not to miss your dose. Call your doctor or health care professional if you are unable to keep an appointment. What may interact with this medicine? Do not take this medicine with any of the following medications:  other medicines containing denosumab This medicine may also interact with the following medications:  medicines that lower your chance of fighting infection  steroid medicines like prednisone or cortisone This list may not describe all possible interactions. Give your health care provider a list of all the medicines, herbs, non-prescription drugs, or dietary supplements you use. Also tell them if you smoke, drink alcohol, or use illegal drugs. Some items may interact with your medicine. What should I watch for while using this medicine? Visit your doctor or health care professional for regular checks on your progress. Your doctor or health care professional may order blood tests and other tests to see how you are doing. Call your doctor or health care professional for advice if you get a fever, chills or sore throat, or other symptoms of a cold or flu. Do not treat yourself. This drug may decrease your body's ability to fight infection. Try to avoid being around people who are sick. You should make sure you get enough calcium and vitamin D while you are taking this medicine, unless your doctor tells you not to. Discuss the foods you eat and the vitamins you take with your health care professional. See your dentist regularly. Brush and floss your teeth as directed. Before you have any dental work done, tell your dentist you are   receiving this medicine. Do not become pregnant while taking this medicine or for 5 months after stopping it. Talk with your doctor or health care professional about your birth control options while taking this medicine. Women should inform their doctor if they wish to become pregnant or think they might be pregnant. There is a potential for serious side  effects to an unborn child. Talk to your health care professional or pharmacist for more information. What side effects may I notice from receiving this medicine? Side effects that you should report to your doctor or health care professional as soon as possible:  allergic reactions like skin rash, itching or hives, swelling of the face, lips, or tongue  bone pain  breathing problems  dizziness  jaw pain, especially after dental work  redness, blistering, peeling of the skin  signs and symptoms of infection like fever or chills; cough; sore throat; pain or trouble passing urine  signs of low calcium like fast heartbeat, muscle cramps or muscle pain; pain, tingling, numbness in the hands or feet; seizures  unusual bleeding or bruising  unusually weak or tired Side effects that usually do not require medical attention (report to your doctor or health care professional if they continue or are bothersome):  constipation  diarrhea  headache  joint pain  loss of appetite  muscle pain  runny nose  tiredness  upset stomach This list may not describe all possible side effects. Call your doctor for medical advice about side effects. You may report side effects to FDA at 1-800-FDA-1088. Where should I keep my medicine? This medicine is only given in a clinic, doctor's office, or other health care setting and will not be stored at home. NOTE: This sheet is a summary. It may not cover all possible information. If you have questions about this medicine, talk to your doctor, pharmacist, or health care provider.  2020 Elsevier/Gold Standard (2018-03-18 16:10:44)

## 2021-02-27 ENCOUNTER — Ambulatory Visit: Payer: Medicare Other

## 2021-04-22 DIAGNOSIS — Z1331 Encounter for screening for depression: Secondary | ICD-10-CM | POA: Diagnosis not present

## 2021-04-22 DIAGNOSIS — Z0000 Encounter for general adult medical examination without abnormal findings: Secondary | ICD-10-CM | POA: Diagnosis not present

## 2021-04-22 DIAGNOSIS — R0789 Other chest pain: Secondary | ICD-10-CM | POA: Diagnosis not present

## 2021-04-22 DIAGNOSIS — Z6828 Body mass index (BMI) 28.0-28.9, adult: Secondary | ICD-10-CM | POA: Diagnosis not present

## 2021-04-22 DIAGNOSIS — R002 Palpitations: Secondary | ICD-10-CM | POA: Diagnosis not present

## 2021-04-29 DIAGNOSIS — R079 Chest pain, unspecified: Secondary | ICD-10-CM

## 2021-04-29 DIAGNOSIS — R0789 Other chest pain: Secondary | ICD-10-CM | POA: Diagnosis not present

## 2021-04-29 DIAGNOSIS — R0989 Other specified symptoms and signs involving the circulatory and respiratory systems: Secondary | ICD-10-CM | POA: Diagnosis not present

## 2021-04-29 DIAGNOSIS — I6523 Occlusion and stenosis of bilateral carotid arteries: Secondary | ICD-10-CM | POA: Diagnosis not present

## 2021-05-06 DIAGNOSIS — Z6828 Body mass index (BMI) 28.0-28.9, adult: Secondary | ICD-10-CM | POA: Insufficient documentation

## 2021-05-06 DIAGNOSIS — E041 Nontoxic single thyroid nodule: Secondary | ICD-10-CM | POA: Insufficient documentation

## 2021-05-06 DIAGNOSIS — R0789 Other chest pain: Secondary | ICD-10-CM | POA: Insufficient documentation

## 2021-05-06 DIAGNOSIS — H9190 Unspecified hearing loss, unspecified ear: Secondary | ICD-10-CM | POA: Insufficient documentation

## 2021-05-06 DIAGNOSIS — R002 Palpitations: Secondary | ICD-10-CM | POA: Insufficient documentation

## 2021-05-06 DIAGNOSIS — R0989 Other specified symptoms and signs involving the circulatory and respiratory systems: Secondary | ICD-10-CM | POA: Insufficient documentation

## 2021-05-06 DIAGNOSIS — E785 Hyperlipidemia, unspecified: Secondary | ICD-10-CM | POA: Insufficient documentation

## 2021-05-06 DIAGNOSIS — I10 Essential (primary) hypertension: Secondary | ICD-10-CM | POA: Insufficient documentation

## 2021-05-06 DIAGNOSIS — K760 Fatty (change of) liver, not elsewhere classified: Secondary | ICD-10-CM | POA: Insufficient documentation

## 2021-05-07 ENCOUNTER — Encounter: Payer: Self-pay | Admitting: Cardiology

## 2021-05-07 ENCOUNTER — Ambulatory Visit (INDEPENDENT_AMBULATORY_CARE_PROVIDER_SITE_OTHER): Payer: Medicare Other | Admitting: Cardiology

## 2021-05-07 ENCOUNTER — Other Ambulatory Visit: Payer: Self-pay

## 2021-05-07 VITALS — BP 140/64 | HR 57 | Ht 60.0 in | Wt 157.8 lb

## 2021-05-07 DIAGNOSIS — I259 Chronic ischemic heart disease, unspecified: Secondary | ICD-10-CM | POA: Diagnosis not present

## 2021-05-07 DIAGNOSIS — R0789 Other chest pain: Secondary | ICD-10-CM | POA: Diagnosis not present

## 2021-05-07 DIAGNOSIS — E782 Mixed hyperlipidemia: Secondary | ICD-10-CM

## 2021-05-07 DIAGNOSIS — I209 Angina pectoris, unspecified: Secondary | ICD-10-CM

## 2021-05-07 DIAGNOSIS — I10 Essential (primary) hypertension: Secondary | ICD-10-CM

## 2021-05-07 HISTORY — DX: Mixed hyperlipidemia: E78.2

## 2021-05-07 HISTORY — DX: Angina pectoris, unspecified: I20.9

## 2021-05-07 HISTORY — DX: Essential (primary) hypertension: I10

## 2021-05-07 MED ORDER — NITROGLYCERIN 0.4 MG SL SUBL
0.4000 mg | SUBLINGUAL_TABLET | SUBLINGUAL | 6 refills | Status: DC | PRN
Start: 2021-05-07 — End: 2021-12-10

## 2021-05-07 NOTE — Progress Notes (Signed)
Cardiology Office Note:    Date:  05/07/2021   ID:  [name], DOB 21-Aug-1951, MRN [id number]  PCP:  Ronita Hipps, MD  Cardiologist:  Jenean Lindau, MD   Referring MD: Ronita Hipps, MD    ASSESSMENT:    1. Chest pressure   2. Essential hypertension   3. Mixed dyslipidemia   4. Angina pectoris ([place])   5. Chest pain due to myocardial ischemia, unspecified ischemic chest pain type   6. Mixed hyperlipidemia    PLAN:    In order of problems listed above:  Angina pectoris: Patient has multiple risk factors for coronary artery disease including essential hypertension, dyslipidemia and history of significant radiation for breast cancer.  Her stress test is negative but her symptoms are very concerning.  I discussed this with her at length.  I gave her an option about coronary angiography conventional and CT coronary angiography.  sHe wants that and we are fine with this and we will schedule it for her.  She is taking a coated baby aspirin on a daily basis.  Sublingual nitroglycerin prescription was sent, its protocol and 911 protocol explained and the patient vocalized understanding questions were answered to the patient's satisfaction Essential hypertension: Blood pressure stable and diet was emphasized.  Lifestyle modification urged. Mixed dyslipidemia: Diet was emphasized.  Weight reduction was stressed.  Risks of obesity emphasized.  She is on statin therapy and lipids are managed by primary care. Patient will be seen in follow-up appointment in 6 months or earlier if the patient has any concerns    Medication Adjustments/Labs and Tests Ordered: Current medicines are reviewed at length with the patient today.  Concerns regarding medicines are outlined above.  Orders Placed This Encounter  Procedures   CT CORONARY MORPH W/CTA COR W/SCORE W/CA W/CM &/OR WO/CM   Basic Metabolic Panel (BMET)   EKG 12-Lead   Meds ordered this encounter  Medications   nitroGLYCERIN  (NITROSTAT) 0.4 MG SL tablet    Sig: Place 1 tablet (0.4 mg total) under the tongue every 5 (five) minutes as needed.    Dispense:  25 tablet    Refill:  6     History of Present Illness:    Sarah Hubbard is a 70 y.o. female who is being seen today for the evaluation of chest pain and tightness at the request of Ronita Hipps, MD. patient has past medical history of essential hypertension and dyslipidemia.  She is undergoing breast removal surgery and radiation in the past.  This is for breast cancer.  She mentions that she has episode of chest tightness on and off.  She went to [facility] and underwent stress testing and this was unremarkable.  I reviewed these records.  Patient mentions to me that she does have this chest tightness episodes and they are unpredictable.  They may or may not occur with exertion but the concern to her.  Therefore she is here for an evaluation.  At the time of my evaluation, the patient is alert awake oriented and in no distress.  Past Medical History:  Diagnosis Date   BMI 28.0-28.9,adult    Bruit    Chest pressure    Fatty liver    Hearing loss    Hyperlipidemia    Hypertension    Malignant neoplasm of upper-outer quadrant of left female breast [facility])    Other specified disorders of bone density and structure, multiple sites 11/01/2020   Palpitations  Thyroid nodule     Past Surgical History:  Procedure Laterality Date   BREAST RECONSTRUCTION  2015   LEEP     X2   MASTECTOMY     TUBAL LIGATION  1979    Current Medications: Current Meds  Medication Sig   aspirin EC 81 MG tablet Take 81 mg by mouth daily. Swallow whole.   calcium-vitamin D (OSCAL WITH D) 500-200 MG-UNIT tablet Take 1 tablet by mouth daily.   Cholecalciferol 50 MCG (2000 UT) CAPS Take 2,000 Units by mouth daily.   losartan (COZAAR) 25 MG tablet Take 25 mg by mouth daily.   nitroGLYCERIN (NITROSTAT) 0.4 MG SL tablet Place 1 tablet (0.4 mg total) under the  tongue every 5 (five) minutes as needed.   Omega-3 1000 MG CAPS Take 1,000 mg by mouth daily.   pravastatin (PRAVACHOL) 10 MG tablet Take 10 mg by mouth daily.     Allergies:   Antihistamines, diphenhydramine-type and Codeine   Social History   Socioeconomic History   Marital status: Divorced    Spouse name: Not on file   Number of children: Not on file   Years of education: Not on file   Highest education level: Not on file  Occupational History   Not on file  Tobacco Use   Smoking status: Former    Packs/day: 2.00    Years: 32.00    Pack years: 64.00    Types: Cigarettes   Smokeless tobacco: Never   Tobacco comments:    quit 2003  Substance and Sexual Activity   Alcohol use: Not Currently   Drug use: Never   Sexual activity: Not Currently  Other Topics Concern   Not on file  Social History Narrative   Not on file   Social Determinants of Health   Financial Resource Strain: Not on file  Food Insecurity: Not on file  Transportation Needs: Not on file  Physical Activity: Not on file  Stress: Not on file  Social Connections: Not on file     Family History: The patient's family history includes Bone cancer (age of onset: 42) in her half-sister; Breast cancer in her mother; Kidney cancer in her half-brother; Mesothelioma in her father.  ROS:   Please see the history of present illness.    All other systems reviewed and are negative.  EKGs/Labs/Other Studies Reviewed:    The following studies were reviewed today: EKG reveals sinus rhythm and nonspecific ST-T changes.  Stress test revealed no evidence of ischemia with preserved ejection fraction.   Recent Labs: 11/12/2020: ALT 20; BUN 17; Creatinine 0.7; Hemoglobin 13.8; Platelets 265; Potassium 4.0; Sodium 139  Recent Lipid Panel No results found for: CHOL, TRIG, HDL, CHOLHDL, VLDL, LDLCALC, LDLDIRECT  Physical Exam:    VS:  BP 140/64   Pulse (!) 57   Ht 5' (1.524 m)   Wt 157 lb 12.8 oz (71.6 kg)   SpO2  98%   BMI 30.82 kg/m     Wt Readings from Last 3 Encounters:  05/07/21 157 lb 12.8 oz (71.6 kg)  11/26/20 160 lb 4 oz (72.7 kg)  11/26/20 158 lb 11.2 oz (72 kg)     GEN: Patient is in no acute distress HEENT: Normal NECK: No JVD; No carotid bruits LYMPHATICS: No lymphadenopathy CARDIAC: S1 S2 regular, 2/6 systolic murmur at the apex. RESPIRATORY:  Clear to auscultation without rales, wheezing or rhonchi  ABDOMEN: Soft, non-tender, non-distended MUSCULOSKELETAL:  No edema; No deformity  SKIN: Warm and dry NEUROLOGIC:  Alert and oriented x 3 PSYCHIATRIC:  Normal affect    Signed, Jenean Lindau, MD  05/07/2021 11:44 AM    [place]

## 2021-05-07 NOTE — Patient Instructions (Addendum)
Medication Instructions:  Your physician has recommended you make the following change in your medication:   Use nitroglycerin 1 tablet placed under the tongue at the first sign of chest pain or an angina attack. 1 tablet may be used every 5 minutes as needed, for up to 15 minutes. Do not take more than 3 tablets in 15 minutes. If pain persist call 911 or go to the nearest ED.   *If you need a refill on your cardiac medications before your next appointment, please call your pharmacy*   Lab Work: Your physician recommends that you return for lab work in: 1 week before your CT scan. You can come Monday through Friday 8:30 am to 12:00 pm and 1:15 to 4:30. You do not need to make an appointment as the order has already been placed. You will only have a BMET.   If you have labs (blood work) drawn today and your tests are completely normal, you will receive your results only by: [place] (if you have MyChart) OR A paper copy in the mail If you have any lab test that is abnormal or we need to change your treatment, we will call you to review the results.   Testing/Procedures: Your cardiac CT will be scheduled at:   [facility] [place], [place] [address] [phone]   If scheduled at [facility], please arrive at the [facility] main entrance of [facility] 30 minutes prior to test start time. Proceed to the [facility] Radiology Department (first floor) to check-in and test prep.  Please follow these instructions carefully (unless otherwise directed):    On the Night Before the Test: Be sure to Drink plenty of water. Do not consume any caffeinated/decaffeinated beverages or chocolate 12 hours prior to your test. Do not take any antihistamines 12 hours prior to your test.   On the Day of the Test: Drink plenty of water. Do not drink any water within one hour of the test. Do not eat any food 4 hours prior to the test. You may take  your regular medications prior to the test.  FEMALES- please wear underwire-free bra if available   After the Test: Drink plenty of water. After receiving IV contrast, you may experience a mild flushed feeling. This is normal. On occasion, you may experience a mild rash up to 24 hours after the test. This is not dangerous. If this occurs, you can take Benadryl 25 mg and increase your fluid intake. If you experience trouble breathing, this can be serious. If it is severe call 911 IMMEDIATELY. If it is mild, please call our office. If you take any of these medications: Glipizide/Metformin, Avandament, Glucavance, please do not take 48 hours after completing test unless otherwise instructed.   Once we have confirmed authorization from your insurance company, we will call you to set up a date and time for your test. Based on how quickly your insurance processes prior authorizations requests, please allow up to 4 weeks to be contacted for scheduling your Cardiac CT appointment. Be advised that routine Cardiac CT appointments could be scheduled as many as 8 weeks after your provider has ordered it.  For non-scheduling related questions, please contact the cardiac imaging nurse navigator should you have any questions/concerns: Marchia Bond, Cardiac Imaging Nurse Navigator Burley Saver, Interim Cardiac Imaging Nurse [place] [facility] Direct Office Dial: [phone]   For scheduling needs, including cancellations and rescheduling, please call Vivien Rota  at [phone].     Follow-Up: At [facility], you and your health needs are our priority.  As part of our continuing mission to provide you with exceptional heart care, we have created designated Provider Care Teams.  These Care Teams include your primary Cardiologist (physician) and Advanced Practice Providers (APPs -  Physician Assistants and Nurse Practitioners) who all work together to provide you with the care you need,  when you need it.  We recommend signing up for the patient portal called "MyChart".  Sign up information is provided on this After Visit Summary.  MyChart is used to connect with patients for Virtual Visits (Telemedicine).  Patients are able to view lab/test results, encounter notes, upcoming appointments, etc.  Non-urgent messages can be sent to your provider as well.   To learn more about what you can do with MyChart, go to [email or web address].    Your next appointment:   6 month(s)  The format for your next appointment:   In Person  Provider:   Jyl Heinz, MD   Other Instructions Cardiac CT Angiogram A cardiac CT angiogram is a procedure to look at the heart and the area around the heart. It may be done to help find the cause of chest pains or other symptoms of heart disease. During this procedure, a substance called contrast dye is injected into the blood vessels in the area to be checked. A large X-ray machine, called a CT scanner, then takes detailed pictures of the heart and the surrounding area. The procedure is also sometimes called a coronary CT angiogram, coronary artery scanning, or CTA. A cardiac CT angiogram allows the health care provider to see how well blood is flowing to and from the heart. The health care provider will be able to see if there are any problems, such as: Blockage or narrowing of the coronary arteries in the heart. Fluid around the heart. Signs of weakness or disease in the muscles, valves, and tissues of the heart. Tell a health care provider about: Any allergies you have. This is especially important if you have had a previous allergic reaction to contrast dye. All medicines you are taking, including vitamins, herbs, eye drops, creams, and over-the-counter medicines. Any blood disorders you have. Any surgeries you have had. Any medical conditions you have. Whether you are pregnant or may be pregnant. Any anxiety disorders, chronic pain, or other  conditions you have that may increase your stress or prevent you from lying still. What are the risks? Generally, this is a safe procedure. However, problems may occur, including: Bleeding. Infection. Allergic reactions to medicines or dyes. Damage to other structures or organs. Kidney damage from the contrast dye that is used. Increased risk of cancer from radiation exposure. This risk is low. Talk with your health care provider about: The risks and benefits of testing. How you can receive the lowest dose of radiation. What happens before the procedure? Wear comfortable clothing and remove any jewelry, glasses, dentures, and hearing aids. Follow instructions from your health care provider about eating and drinking. This may include: For 12 hours before the procedure -- avoid caffeine. This includes tea, coffee, soda, energy drinks, and diet pills. Drink plenty of water or other fluids that do not have caffeine in them. Being well hydrated can prevent complications. For 4-6 hours before the procedure -- stop eating and drinking. The contrast dye can cause nausea, but this is less likely if your stomach is empty. Ask your health care provider about changing or  stopping your regular medicines. This is especially important if you are taking diabetes medicines, blood thinners, or medicines to treat problems with erections (erectile dysfunction). What happens during the procedure?  Hair on your chest may need to be removed so that small sticky patches called electrodes can be placed on your chest. These will transmit information that helps to monitor your heart during the procedure. An IV will be inserted into one of your veins. You might be given a medicine to control your heart rate during the procedure. This will help to ensure that good images are obtained. You will be asked to lie on an exam table. This table will slide in and out of the CT machine during the procedure. Contrast dye will be  injected into the IV. You might feel warm, or you may get a metallic taste in your mouth. You will be given a medicine called nitroglycerin. This will relax or dilate the arteries in your heart. The table that you are lying on will move into the CT machine tunnel for the scan. The person running the machine will give you instructions while the scans are being done. You may be asked to: Keep your arms above your head. Hold your breath. Stay very still, even if the table is moving. When the scanning is complete, you will be moved out of the machine. The IV will be removed. The procedure may vary among health care providers and hospitals. What can I expect after the procedure? After your procedure, it is common to have: A metallic taste in your mouth from the contrast dye. A feeling of warmth. A headache from the nitroglycerin. Follow these instructions at home: Take over-the-counter and prescription medicines only as told by your health care provider. If you are told, drink enough fluid to keep your urine pale yellow. This will help to flush the contrast dye out of your body. Most people can return to their normal activities right after the procedure. Ask your health care provider what activities are safe for you. It is up to you to get the results of your procedure. Ask your health care provider, or the department that is doing the procedure, when your results will be ready. Keep all follow-up visits as told by your health care provider. This is important. Contact a health care provider if: You have any symptoms of allergy to the contrast dye. These include: Shortness of breath. Rash or hives. A racing heartbeat. Summary A cardiac CT angiogram is a procedure to look at the heart and the area around the heart. It may be done to help find the cause of chest pains or other symptoms of heart disease. During this procedure, a large X-ray machine, called a CT scanner, takes detailed pictures of  the heart and the surrounding area after a contrast dye has been injected into blood vessels in the area. Ask your health care provider about changing or stopping your regular medicines before the procedure. This is especially important if you are taking diabetes medicines, blood thinners, or medicines to treat erectile dysfunction. If you are told, drink enough fluid to keep your urine pale yellow. This will help to flush the contrast dye out of your body. This information is not intended to replace advice given to you by your health care provider. Make sure you discuss any questions you have with your health care provider. Document Revised: 07/05/2019 Document Reviewed: 07/05/2019 Elsevier Patient Education  [place].

## 2021-05-12 DIAGNOSIS — R0789 Other chest pain: Secondary | ICD-10-CM | POA: Diagnosis not present

## 2021-05-12 DIAGNOSIS — I209 Angina pectoris, unspecified: Secondary | ICD-10-CM | POA: Diagnosis not present

## 2021-05-12 LAB — BASIC METABOLIC PANEL
BUN/Creatinine Ratio: 28 (ref 12–28)
BUN: 21 mg/dL (ref 8–27)
CO2: 22 mmol/L (ref 20–29)
Calcium: 10.2 mg/dL (ref 8.7–10.3)
Chloride: 102 mmol/L (ref 96–106)
Creatinine, Ser: 0.75 mg/dL (ref 0.57–1.00)
Glucose: 86 mg/dL (ref 65–99)
Potassium: 4.9 mmol/L (ref 3.5–5.2)
Sodium: 138 mmol/L (ref 134–144)
eGFR: 87 mL/min/{1.73_m2} (ref >59)

## 2021-05-14 ENCOUNTER — Telehealth (HOSPITAL_COMMUNITY): Payer: Self-pay | Admitting: Emergency Medicine

## 2021-05-14 NOTE — Telephone Encounter (Signed)
Pt returning phone call regarding upcoming cardiac imaging study; pt verbalizes understanding of appt date/time, parking situation and where to check in, pre-test NPO status and medications ordered, and verified current allergies; name and call back number provided for further questions should they arise Marchia Bond RN Navigator Cardiac Imaging [facility] [phone] office [phone] cell   No sticks L arm Meds per usual

## 2021-05-14 NOTE — Telephone Encounter (Signed)
Attempted to call patient regarding upcoming cardiac CT appointment. °Left message on voicemail with name and callback number °Liticia Gasior RN Navigator Cardiac Imaging °[facility] °[phone] Office °[phone] Cell ° °

## 2021-05-16 ENCOUNTER — Ambulatory Visit (HOSPITAL_COMMUNITY)
Admission: RE | Admit: 2021-05-16 | Discharge: 2021-05-16 | Disposition: A | Discharge status: Home / Self Care | Payer: Medicare Other | Source: Ambulatory Visit | Attending: Cardiology | Admitting: Cardiology

## 2021-05-16 ENCOUNTER — Other Ambulatory Visit: Payer: Self-pay

## 2021-05-16 DIAGNOSIS — I259 Chronic ischemic heart disease, unspecified: Secondary | ICD-10-CM | POA: Insufficient documentation

## 2021-05-16 MED ORDER — IOHEXOL 350 MG/ML SOLN
100.0000 mL | Freq: Once | INTRAVENOUS | Status: AC | PRN
  Administered 2021-05-16: 100 mL via INTRAVENOUS

## 2021-05-16 MED ORDER — NITROGLYCERIN 0.4 MG SL SUBL
0.8000 mg | SUBLINGUAL_TABLET | Freq: Once | SUBLINGUAL | Status: AC
  Administered 2021-05-16: 0.8 mg via SUBLINGUAL

## 2021-05-16 MED ORDER — NITROGLYCERIN 0.4 MG SL SUBL
SUBLINGUAL_TABLET | SUBLINGUAL | Status: AC
  Filled 2021-05-16: qty 2

## 2021-05-19 NOTE — Addendum Note (Signed)
Addended by: Truddie Hidden on: 05/19/2021 10:54 AM   Modules accepted: Orders

## 2021-05-21 DIAGNOSIS — E782 Mixed hyperlipidemia: Secondary | ICD-10-CM | POA: Diagnosis not present

## 2021-05-21 LAB — LIPID PANEL
Chol/HDL Ratio: 3.9 ratio (ref 0.0–4.4)
Cholesterol, Total: 208 mg/dL — ABNORMAL HIGH (ref 100–199)
HDL: 53 mg/dL (ref >39)
LDL Chol Calc (NIH): 135 mg/dL — ABNORMAL HIGH (ref 0–99)
Triglycerides: 114 mg/dL (ref 0–149)
VLDL Cholesterol Cal: 20 mg/dL (ref 5–40)

## 2021-05-21 LAB — HEPATIC FUNCTION PANEL
ALT: 13 IU/L (ref 0–32)
AST: 14 IU/L (ref 0–40)
Albumin: 4.2 g/dL (ref 3.8–4.8)
Alkaline Phosphatase: 64 IU/L (ref 44–121)
Bilirubin Total: 0.3 mg/dL (ref 0.0–1.2)
Bilirubin, Direct: <0.1 mg/dL (ref 0.00–0.40)
Total Protein: 6.9 g/dL (ref 6.0–8.5)

## 2021-05-22 ENCOUNTER — Other Ambulatory Visit: Payer: Self-pay

## 2021-05-23 NOTE — Addendum Note (Signed)
Addended by: Truddie Hidden on: 05/23/2021 08:15 AM   Modules accepted: Orders

## 2021-05-27 ENCOUNTER — Ambulatory Visit: Payer: Medicare Other | Admitting: Oncology

## 2021-05-27 ENCOUNTER — Other Ambulatory Visit: Payer: Medicare Other

## 2021-05-29 ENCOUNTER — Ambulatory Visit: Payer: Medicare Other

## 2021-09-22 DIAGNOSIS — I10 Essential (primary) hypertension: Secondary | ICD-10-CM | POA: Diagnosis not present

## 2021-09-22 DIAGNOSIS — E785 Hyperlipidemia, unspecified: Secondary | ICD-10-CM | POA: Diagnosis not present

## 2021-09-22 DIAGNOSIS — Z6828 Body mass index (BMI) 28.0-28.9, adult: Secondary | ICD-10-CM | POA: Diagnosis not present

## 2021-11-20 NOTE — Progress Notes (Signed)
[place]  [address] [place],  [place]  [address] [phone]  Clinic Day:  11/27/2021  Referring physician: Ronita Hipps, MD   This document serves as a record of services personally performed by Hosie Poisson, MD. It was created on their behalf by [facility], a trained medical scribe. The creation of this record is based on the scribe's personal observations and the provider's statements to them.  CHIEF COMPLAINT:  CC: History of stage IIB hormone receptor positive breast cancer  Current Treatment:  Surveillance    HISTORY OF PRESENT ILLNESS:  Sarah Hubbard is a 70 y.o. 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 We began seeing her in March 2016, when she relocated to the area.  She was treated with bilateral mastectomies and reconstruction in [place].  Pathology revealed 2 lesions in the left breast measuring 2.5 cm and 1.1 cm respectively, which were grade 2, invasive lobular carcinoma. 1 of 4 lymph nodes were positive.  Pathology of the right breast was benign.  Estrogen receptors were positive, progesterone receptors negative and her 2 Neu negative.  She had surgical wound dehiscence of the reconstruction and abdominal wounds, but eventually healed.  She received adjuvant chemotherapy with dose dense doxorubicin and cyclophosphamide for 4 cycles, followed by dose dense paclitaxel for 4 cycles.  Chemotherapy was completed in August 2015.  She received adjuvant radiation to the left reconstruction and axilla completed in October 2015.  She was then placed on anastrozole 1 mg daily in November 2015.  Due to her personal and family history she states she saw a genetic counselor regarding testing for hereditary breast and ovarian cancer.  Her mother had breast cancer in her 43's and a maternal grandmother had lung cancer.  As the patient's daughter had already  undergone testing for hereditary breast and ovarian cancer, which was negative, the patient did not wish to have testing.  Bone density scan was in October 2017 and revealed osteopenia, with a T-score of -1.8 of the femur and -0.7 of the spine.  The bone density had previously been normal with a T-score of -0.6 of the femur.  Due to the osteopenia while on anastrozole, she was instructed to take calcium/vitamin-D twice daily and placed on Prolia injections every 6 months in January 2018.  She had removal of polyps on previous colonoscopy, so Dr. Helene Kelp had her see Dr. Melina Copa.  He recommended repeat colonoscopy in 5 years.  Bone density scan in October 2019 revealed improvement in her osteopenia with a T-score of -0.9 in the spine and a T-score -1.6 in the femur.  At her visit in January 2020, she was concerned about a tender lump under her left breast reconstruction.  There was some firmness of the inferior left reconstruction.  She therefore underwent a left diagnostic mammogram and ultrasound, which did not reveal any abnormality.  She stopped her anastrozole in January 2021.  Bone density scan from October 2021 revealed osteopenia with a T-score of -1.6 of the right femur neck, stable.  Dual femur total mean is normal at -1.0, previously -1.1.  AP spine is normal at -0.5, previously -0.9.  With this good of improvement, Prolia was discontinued in January 2022.  INTERVAL HISTORY:  [name] is here for annual follow up and has been well and denies complaints. Her  appetite is good, and she has lost 2 and 1/2 pounds since her  last visit.  She denies fever, chills or other signs of infection.  She denies nausea, vomiting, bowel issues, or abdominal pain.  She denies sore throat, cough, dyspnea, or chest pain.  REVIEW OF SYSTEMS:  Review of Systems  Constitutional: Negative.  Negative for appetite change, chills, fatigue, fever and unexpected weight change.  HENT:  Negative.    Eyes: Negative.   Respiratory:  Negative.  Negative for chest tightness, cough, hemoptysis, shortness of breath and wheezing.   Cardiovascular: Negative.  Negative for chest pain, leg swelling and palpitations.  Gastrointestinal: Negative.  Negative for abdominal distention, abdominal pain, blood in stool, constipation, diarrhea, nausea and vomiting.  Endocrine: Negative.   Genitourinary: Negative.  Negative for difficulty urinating, dysuria, frequency and hematuria.   Musculoskeletal: Negative.  Negative for arthralgias, back pain, flank pain, gait problem and myalgias.  Skin: Negative.   Neurological: Negative.  Negative for dizziness, extremity weakness, gait problem, headaches, light-headedness, numbness, seizures and speech difficulty.  Hematological: Negative.   Psychiatric/Behavioral: Negative.  Negative for depression and sleep disturbance. The patient is not nervous/anxious.     VITALS:  Blood pressure (!) 156/70, pulse (!) 56, temperature 97.6 F (36.4 C), temperature source Oral, resp. rate 18, height 5' (1.524 m), weight 154 lb 8 oz (70.1 kg), SpO2 97 %.  Wt Readings from Last 3 Encounters:  11/26/21 154 lb 8 oz (70.1 kg)  05/07/21 157 lb 12.8 oz (71.6 kg)  11/26/20 160 lb 4 oz (72.7 kg)    Body mass index is 30.17 kg/m.  Performance status (ECOG): 0 - Asymptomatic  PHYSICAL EXAM:  Physical Exam Constitutional:      General: She is not in acute distress.    Appearance: Normal appearance. She is normal weight.  HENT:     Head: Normocephalic and atraumatic.  Eyes:     General: No scleral icterus.    Extraocular Movements: Extraocular movements intact.     Conjunctiva/sclera: Conjunctivae normal.     Pupils: Pupils are equal, round, and reactive to light.  Cardiovascular:     Rate and Rhythm: Regular rhythm. Bradycardia present.     Pulses: Normal pulses.     Heart sounds: Normal heart sounds. No murmur heard.   No friction rub. No gallop.  Pulmonary:     Effort: Pulmonary effort is normal. No  respiratory distress.     Breath sounds: Normal breath sounds.  Chest:     Comments: Bilateral reconstructions and negative. She has no implants just fatty tissue. Abdominal:     General: Bowel sounds are normal. There is no distension.     Palpations: Abdomen is soft. There is no hepatomegaly, splenomegaly or mass.     Tenderness: There is no abdominal tenderness.  Musculoskeletal:        General: Normal range of motion.     Cervical back: Normal range of motion and neck supple.     Right lower leg: No edema.     Left lower leg: No edema.  Lymphadenopathy:     Cervical: No cervical adenopathy.  Skin:    General: Skin is warm and dry.  Neurological:     General: No focal deficit present.     Mental Status: She is alert and oriented to person, place, and time. Mental status is at baseline.  Psychiatric:        Mood and Affect: Mood normal.        Behavior: Behavior normal.        Thought Content: Thought content  normal.        Judgment: Judgment normal.    LABS:   CBC Latest Ref Rng & Units 11/12/2020  WBC - 6.8  Hemoglobin 12.0 - 16.0 13.8  Hematocrit 36 - 46 41  Platelets 150 - 399 265   CMP Latest Ref Rng & Units 05/21/2021 05/12/2021 11/12/2020  Glucose 65 - 99 mg/dL - 86 -  BUN 8 - 27 mg/dL - 21 17  Creatinine 0.57 - 1.00 mg/dL - 0.75 0.7  Sodium 134 - 144 mmol/L - 138 139  Potassium 3.5 - 5.2 mmol/L - 4.9 4.0  Chloride 96 - 106 mmol/L - 102 105  CO2 20 - 29 mmol/L - 22 28(A)  Calcium 8.7 - 10.3 mg/dL - 10.2 10.1  Total Protein 6.0 - 8.5 g/dL 6.9 - -  Total Bilirubin 0.0 - 1.2 mg/dL 0.3 - -  Alkaline Phos 44 - 121 IU/L 64 - 66  AST 0 - 40 IU/L 14 - 25  ALT 0 - 32 IU/L 13 - 20    STUDIES:   Allergies:  Allergies  Allergen Reactions   Antihistamines, Diphenhydramine-Type    Codeine     Upset stomach    Current Medications: Current Outpatient Medications  Medication Sig Dispense Refill   aspirin EC 81 MG tablet Take 81 mg by mouth daily. Swallow whole.      calcium-vitamin D (OSCAL WITH D) 500-200 MG-UNIT tablet Take 1 tablet by mouth daily.     Cholecalciferol 50 MCG (2000 UT) CAPS Take 2,000 Units by mouth daily.     losartan (COZAAR) 25 MG tablet Take 25 mg by mouth daily.     nitroGLYCERIN (NITROSTAT) 0.4 MG SL tablet Place 1 tablet (0.4 mg total) under the tongue every 5 (five) minutes as needed. 25 tablet 6   Omega-3 1000 MG CAPS Take 1,000 mg by mouth daily.     pravastatin (PRAVACHOL) 10 MG tablet Take 10 mg by mouth daily.     No current facility-administered medications for this visit.     ASSESSMENT & PLAN:   Assessment:   1. History of IIB hormone receptor positive breast cancer, nearly 8 years ago.  She remains without evidence of recurrence.  She completed 5+ years of anastrozole in January 2021.  2. Osteopenia, for which she was on Prolia every 6 months, in addition to calcium and vitamin-D.  Recent bone density shows good improvement so we discontinued her Prolia. She knows to continue with daily calcium and vitamin D.  She will be due for repeat bone density in October 2023.  3. Mild stable bradycardia.  The patient is asymptomatic.   Plan: She knows to continue oral calcium and vitamin D.  We will plan to see her back in 1 year with bone density scan for repeat evaluation.  We will ask Dr. Helene Kelp to include a CBC and CMP with her annual blood work. The patient understands the plans discussed today and is in agreement with them.  She knows to contact our office if she develops concerns regarding her breast cancer or its treatment.   I provided 20 minutes of face-to-face time during this this encounter and > 50% was spent counseling as documented under my assessment and plan.    Derwood Kaplan, MD [facility] [address] [place] [address] Dept: [phone] Dept Fax: [phone]   I, Rita Ohara, am acting as scribe for Derwood Kaplan,  MD  I have reviewed this report as typed by the medical scribe, and it is complete and accurate.

## 2021-11-26 ENCOUNTER — Other Ambulatory Visit: Payer: Self-pay | Admitting: Oncology

## 2021-11-26 ENCOUNTER — Inpatient Hospital Stay: Payer: Medicare Other | Attending: Oncology | Admitting: Oncology

## 2021-11-26 ENCOUNTER — Encounter: Payer: Self-pay | Admitting: Oncology

## 2021-11-26 VITALS — BP 156/70 | HR 56 | Temp 97.6°F | Resp 18 | Ht 60.0 in | Wt 154.5 lb

## 2021-11-26 DIAGNOSIS — M8589 Other specified disorders of bone density and structure, multiple sites: Secondary | ICD-10-CM

## 2021-11-26 DIAGNOSIS — C50412 Malignant neoplasm of upper-outer quadrant of left female breast: Secondary | ICD-10-CM

## 2021-11-26 DIAGNOSIS — Z170 Estrogen receptor positive status [ER+]: Secondary | ICD-10-CM

## 2021-11-26 DIAGNOSIS — M8580 Other specified disorders of bone density and structure, unspecified site: Secondary | ICD-10-CM | POA: Diagnosis not present

## 2021-11-26 DIAGNOSIS — Z780 Asymptomatic menopausal state: Secondary | ICD-10-CM | POA: Diagnosis not present

## 2021-12-01 DIAGNOSIS — Z780 Asymptomatic menopausal state: Secondary | ICD-10-CM

## 2021-12-01 DIAGNOSIS — M8580 Other specified disorders of bone density and structure, unspecified site: Secondary | ICD-10-CM | POA: Insufficient documentation

## 2021-12-01 HISTORY — DX: Other specified disorders of bone density and structure, unspecified site: M85.80

## 2021-12-01 HISTORY — DX: Asymptomatic menopausal state: Z78.0

## 2021-12-10 ENCOUNTER — Other Ambulatory Visit: Payer: Self-pay

## 2021-12-12 ENCOUNTER — Encounter: Payer: Self-pay | Admitting: Cardiology

## 2021-12-12 ENCOUNTER — Ambulatory Visit (INDEPENDENT_AMBULATORY_CARE_PROVIDER_SITE_OTHER): Payer: Medicare Other | Admitting: Cardiology

## 2021-12-12 ENCOUNTER — Other Ambulatory Visit: Payer: Self-pay

## 2021-12-12 VITALS — BP 132/70 | HR 46 | Ht 60.0 in | Wt 155.0 lb

## 2021-12-12 DIAGNOSIS — I10 Essential (primary) hypertension: Secondary | ICD-10-CM | POA: Diagnosis not present

## 2021-12-12 DIAGNOSIS — E782 Mixed hyperlipidemia: Secondary | ICD-10-CM

## 2021-12-12 DIAGNOSIS — I2510 Atherosclerotic heart disease of native coronary artery without angina pectoris: Secondary | ICD-10-CM | POA: Diagnosis not present

## 2021-12-12 HISTORY — DX: Atherosclerotic heart disease of native coronary artery without angina pectoris: I25.10

## 2021-12-12 NOTE — Progress Notes (Signed)
Cardiology Office Note:    Date:  12/12/2021   ID:  Sarah Hubbard, DOB 07/09/1951, MRN [id number]  PCP:  Sarah Hipps, MD  Cardiologist:  Sarah Lindau, MD   Referring MD: Sarah Hipps, MD    ASSESSMENT:    1. Essential hypertension   2. Atherosclerosis of native coronary artery of native heart without angina pectoris   3. Mixed dyslipidemia    PLAN:    In order of problems listed above:  Coronary atherosclerosis: Secondary prevention stressed with patient.  Importance of compliance with diet medication stressed and she vocalized understanding.  Coronary CT angiography report has been mentioned below.  She is doing excellent in terms of secondary prevention and exercise and I congratulated her. Essential hypertension: Blood pressure stable and diet was emphasized. Mixed dyslipidemia: Lipids followed by primary care doctor.  She tells me that she had blood work checked by primary care and her cholesterol was fine.  Her goal LDL must be less than 70. Aortic atherosclerosis: Stable at this time. Patient will be seen in follow-up appointment in 9 months or earlier if the patient has any concerns    Medication Adjustments/Labs and Tests Ordered: Current medicines are reviewed at length with the patient today.  Concerns regarding medicines are outlined above.  No orders of the defined types were placed in this encounter.  No orders of the defined types were placed in this encounter.    Chief Complaint  Patient presents with   Follow-up     History of Present Illness:    Sarah Hubbard is a 71 y.o. female.  Patient was evaluated by me for chest pain.  CT coronary angiography revealed mild coronary artery disease.  Subsequently she has done fine.  No chest pain orthopnea or PND.  At the time of my evaluation, the patient is alert awake oriented and in no distress.  She tells me that she walks almost an hour on a daily basis with  Past Medical History:   Diagnosis Date   Angina pectoris ([place]) 05/07/2021   BMI 28.0-28.9,adult    Bruit    Chest pressure    Essential hypertension 05/07/2021   Fatty liver    Hearing loss    Hyperlipidemia    Hypertension    Malignant neoplasm of upper-outer quadrant of left female breast ([place])    Mixed dyslipidemia 05/07/2021   Osteopenia after menopause 12/01/2021   Prolia stopped in 2022   Other specified disorders of bone density and structure, multiple sites 11/01/2020   Palpitations    Thyroid nodule     Past Surgical History:  Procedure Laterality Date   BREAST RECONSTRUCTION  2015   LEEP     X2   MASTECTOMY     TUBAL LIGATION  1979    Current Medications: Current Meds  Medication Sig   aspirin EC 81 MG tablet Take 81 mg by mouth daily. Swallow whole.   calcium-vitamin D (OSCAL WITH D) 500-200 MG-UNIT tablet Take 1 tablet by mouth daily.   Cholecalciferol 50 MCG (2000 UT) CAPS Take 2,000 Units by mouth daily.   losartan (COZAAR) 25 MG tablet Take 25 mg by mouth daily.   nitroGLYCERIN (NITROSTAT) 0.4 MG SL tablet Place 0.4 mg under the tongue every 5 (five) minutes as needed for chest pain.   Omega-3 1000 MG CAPS Take 1,000 mg by mouth daily.   pravastatin (PRAVACHOL) 10 MG tablet Take 10 mg by mouth daily.     Allergies:  Antihistamines, diphenhydramine-type and Codeine   Social History   Socioeconomic History   Marital status: Divorced    Spouse name: Not on file   Number of children: Not on file   Years of education: Not on file   Highest education level: Not on file  Occupational History   Not on file  Tobacco Use   Smoking status: Former    Packs/day: 2.00    Years: 32.00    Pack years: 64.00    Types: Cigarettes   Smokeless tobacco: Never   Tobacco comments:    quit 2003  Substance and Sexual Activity   Alcohol use: Not Currently   Drug use: Never   Sexual activity: Not Currently  Other Topics Concern   Not on file  Social History Narrative   Not on file    Social Determinants of Health   Financial Resource Strain: Not on file  Food Insecurity: Not on file  Transportation Needs: Not on file  Physical Activity: Not on file  Stress: Not on file  Social Connections: Not on file     Family History: The patient's family history includes Bone cancer (age of onset: 24) in her half-sister; Breast cancer in her mother; Kidney cancer in her half-brother; Mesothelioma in her father.  ROS:   Please see the history of present illness.    All other systems reviewed and are negative.  EKGs/Labs/Other Studies Reviewed:    The following studies were reviewed today: 05/16/2021 11:24   HISTORY: 71 yo female with chest pain/anginal equiv, ECGs and troponins normal   EXAM: Cardiac/Coronary CTA   TECHNIQUE: The patient was scanned on a [employer].   PROTOCOL: A 100 kV prospective scan was triggered in the descending thoracic aorta at 111 HU's. Axial non-contrast 3 mm slices were carried out through the heart. The data set was analyzed on a dedicated work station and scored using the [place]. Gantry rotation speed was 250 msecs and collimation was .6 mm. Beta blockade and 0.8 mg of sl NTG was given. The 3D data set was reconstructed in 5% intervals of the 67-82 % of the R-R cycle. Diastolic phases were analyzed on a dedicated work station using MPR, MIP and VRT modes. The patient received 165mL OMNIPAQUE IOHEXOL 350 MG/ML SOLN of contrast.   FINDINGS: Quality: Good, HR 67   Coronary calcium score: The patient's coronary artery calcium score is 68.8, which places the patient in the 71st percentile.   Coronary arteries: Normal coronary origins.  Right dominance.   Right Coronary Artery: Dominant. No significant disease. Large acute marginal branch without disease.   Left Main Coronary Artery: Minimal mixed 1-24% stenosis (CADRADS1). Bifurcates as usual into the LAD and LCx arteries.   Left Anterior Descending  Coronary Artery: Anterior vessel that wraps around the apex. There is minimal non-obstructive CAD. 2 smaller diagonal branches without disease.   Left Circumflex Artery: AV groove vessel with minimal mixed 1-24% proximal stenosis (CADRADS1). Large proximal OM1 branch without disease.   Aorta: Normal size, 28 mm at the mid ascending aorta (level of the PA bifurcation) measured double oblique. Aortic atherosclerosis. No dissection.   Aortic Valve: Trileaflet.  Annular calcification is noted.   Other findings:   Normal pulmonary vein drainage into the left atrium.   Normal left atrial appendage without a thrombus.   Normal size of the pulmonary artery.   IMPRESSION: 1. Minimal mixed non-obstructive CAD, CADRADS = 1.   2. Coronary calcium score of 68.8. This was  71st percentile for age and sex matched control.   3. Normal coronary origin with right dominance.   4. Trileaflet aortic valve with annular calcification   5. Aortic atherosclerosis.   6. Continued cardiovascular risk reduction is recommended.     Electronically Signed   By: Pixie Casino M.D.   On: 05/16/2021 11:24    Addended by Pixie Casino, MD on 05/16/2021 11:27 AM   Study Result  Narrative & Impression  EXAM: OVER-READ INTERPRETATION  CT CHEST   The following report is an over-read performed by radiologist Dr. Vinnie Langton of [facility], [place] on 05/16/2021. This over-read does not include interpretation of cardiac or coronary anatomy or pathology. The coronary calcium score/coronary CTA interpretation by the cardiologist is attached.   COMPARISON:  None.   FINDINGS: Atherosclerotic calcifications in the thoracic aorta. Within the visualized portions of the thorax there are no suspicious appearing pulmonary nodules or masses, there is no acute consolidative airspace disease, no pleural effusions, no pneumothorax and no lymphadenopathy. Visualized portions of the upper abdomen  are unremarkable. There are no aggressive appearing lytic or blastic lesions noted in the visualized portions of the skeleton.   IMPRESSION: 1.  Aortic Atherosclerosis (ICD10-I70.0).   Electronically Signed: By: Vinnie Langton M.D. On: 05/16/2021 10:42       Recent Labs: 05/12/2021: BUN 21; Creatinine, Ser 0.75; Potassium 4.9; Sodium 138 05/21/2021: ALT 13  Recent Lipid Panel    Component Value Date/Time   CHOL 208 (H) 05/21/2021 0846   TRIG 114 05/21/2021 0846   HDL 53 05/21/2021 0846   CHOLHDL 3.9 05/21/2021 0846   LDLCALC 135 (H) 05/21/2021 0846    Physical Exam:    VS:  BP 132/70 (BP Location: Left Arm, Patient Position: Sitting, Cuff Size: Normal)    Pulse (!) 46    Ht 5' (1.524 m)    Wt 155 lb (70.3 kg)    SpO2 96%    BMI 30.27 kg/m     Wt Readings from Last 3 Encounters:  12/12/21 155 lb (70.3 kg)  11/26/21 154 lb 8 oz (70.1 kg)  05/07/21 157 lb 12.8 oz (71.6 kg)     GEN: Patient is in no acute distress HEENT: Normal NECK: No JVD; No carotid bruits LYMPHATICS: No lymphadenopathy CARDIAC: Hear sounds regular, 2/6 systolic murmur at the apex. RESPIRATORY:  Clear to auscultation without rales, wheezing or rhonchi  ABDOMEN: Soft, non-tender, non-distended MUSCULOSKELETAL:  No edema; No deformity  SKIN: Warm and dry NEUROLOGIC:  Alert and oriented x 3 PSYCHIATRIC:  Normal affect   Signed, Sarah Lindau, MD  12/12/2021 10:50 AM    [place]

## 2021-12-12 NOTE — Patient Instructions (Signed)
Medication Instructions:  Your physician recommends that you continue on your current medications as directed. Please refer to the Current Medication list given to you today.  *If you need a refill on your cardiac medications before your next appointment, please call your pharmacy*   Lab Work: None If you have labs (blood work) drawn today and your tests are completely normal, you will receive your results only by: [place] (if you have MyChart) OR A paper copy in the mail If you have any lab test that is abnormal or we need to change your treatment, we will call you to review the results.   Testing/Procedures: None   Follow-Up: At [facility], you and your health needs are our priority.  As part of our continuing mission to provide you with exceptional heart care, we have created designated Provider Care Teams.  These Care Teams include your primary Cardiologist (physician) and Advanced Practice Providers (APPs -  Physician Assistants and Nurse Practitioners) who all work together to provide you with the care you need, when you need it.  We recommend signing up for the patient portal called "MyChart".  Sign up information is provided on this After Visit Summary.  MyChart is used to connect with patients for Virtual Visits (Telemedicine).  Patients are able to view lab/test results, encounter notes, upcoming appointments, etc.  Non-urgent messages can be sent to your provider as well.   To learn more about what you can do with MyChart, go to [email or web address].    Your next appointment:   9 month(s)  The format for your next appointment:   In Person  Provider:   Jyl Heinz, MD    Other Instructions

## 2021-12-25 IMAGING — CT CT HEART MORP W/ CTA COR W/ SCORE W/ CA W/CM &/OR W/O CM
4 of 7 series · 8 of 20 positions shown, 9 images · IV contrast (omnipaque)
Comparison: None.
COMPARISON: None.

Addendum:
EXAM:
OVER-READ INTERPRETATION  CT CHEST

The following report is an over-read performed by radiologist Dr.
Yehikon Youthfashion [REDACTED] on 05/16/2021. This
over-read does not include interpretation of cardiac or coronary
anatomy or pathology. The coronary calcium score/coronary CTA
interpretation by the cardiologist is attached.
HISTORY: 69 yo female with chest pain/anginal equiv, ECGs and troponins
normal
Cardiac/Coronary CTA
TECHNIQUE: The patient was scanned on a [employer] scanner.
PROTOCOL: A 100 kV prospective scan was triggered in the descending thoracic
aorta at 111 HU's. Axial non-contrast 3 mm slices were carried out
through the heart. The data set was analyzed on a dedicated work
station and scored using the [place] method. Gantry rotation speed
was 250 msecs and collimation was .6 mm. Beta blockade and 0.8 mg of
sl NTG was given. The 3D data set was reconstructed in 5% intervals
of the 67-82 % of the R-R cycle. Diastolic phases were analyzed on a
dedicated work station using MPR, MIP and VRT modes. The patient
received 100mL OMNIPAQUE IOHEXOL 350 MG/ML SOLN of contrast.

[Series 6: best diast 72 % · axial · 0.39mm/px · z∈[+1219,+1256]mm · 2 of 273 slices shown]
[im 91/273  vessel]
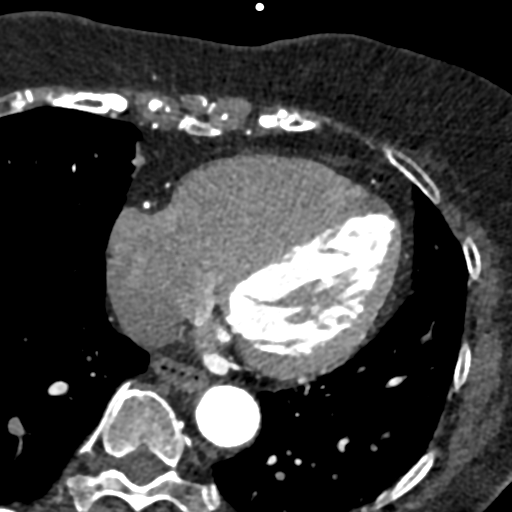
[im 182/273  vessel]
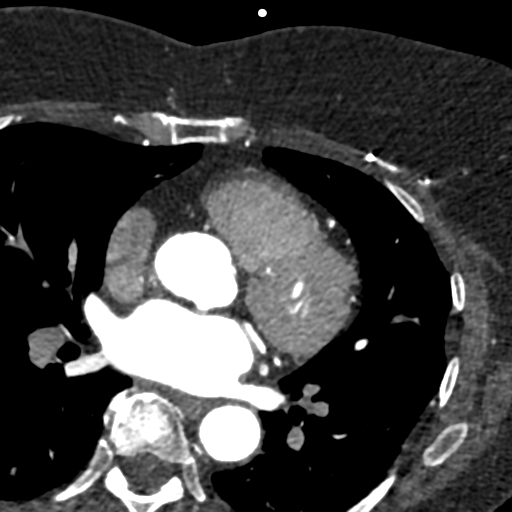

[Series 9: best syst · axial · 0.39mm/px · z∈[+1219,+1256]mm · 2 of 273 slices shown, 3 images]
[im 91/273  vessel]
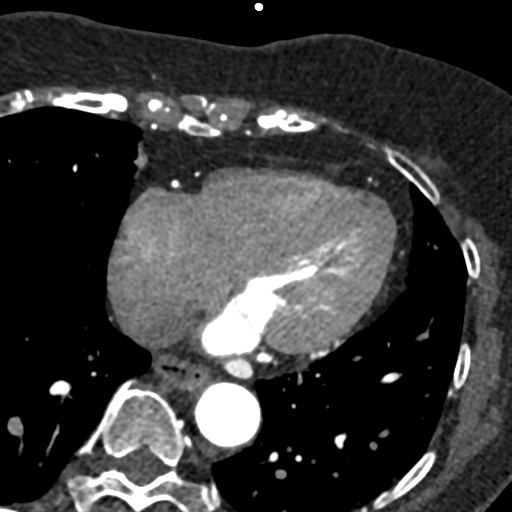
[im 91/273  lung]
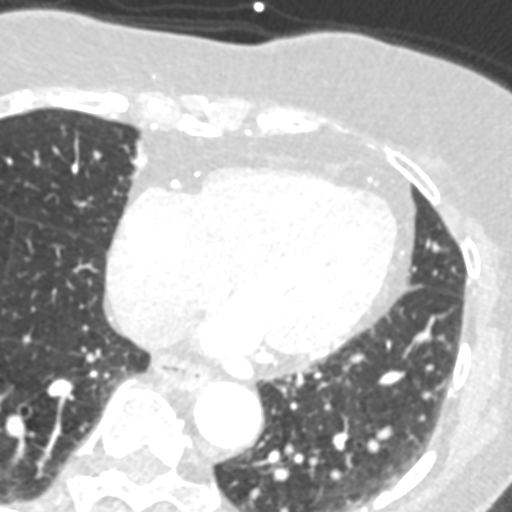
[im 182/273  vessel]
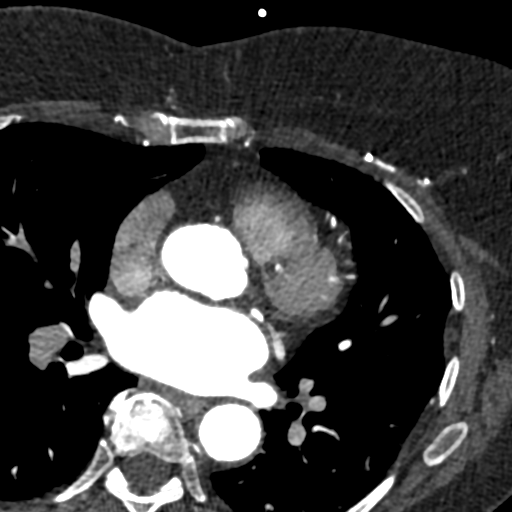

[Series 10: ts syst sharp · axial · 0.39mm/px · z∈[+1219,+1256]mm · 2 of 273 slices shown]
[im 91/273  lung]
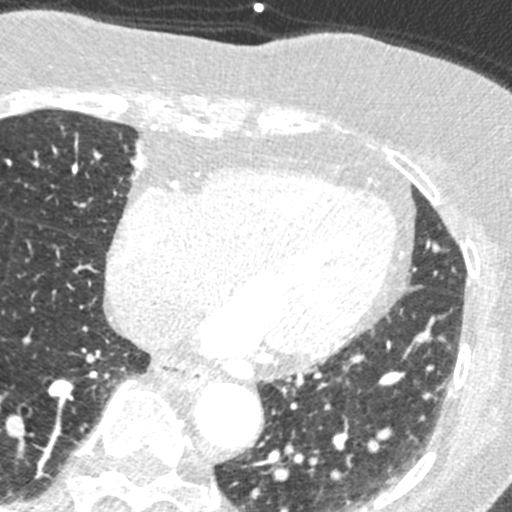
[im 182/273  lung]
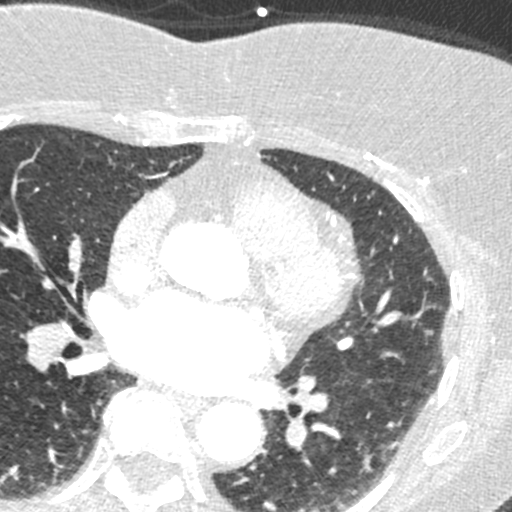

[Series 12: ts diast sharp 72 % · axial · 0.39mm/px · z∈[+1219,+1256]mm · 2 of 273 slices shown]
[im 91/273  lung]
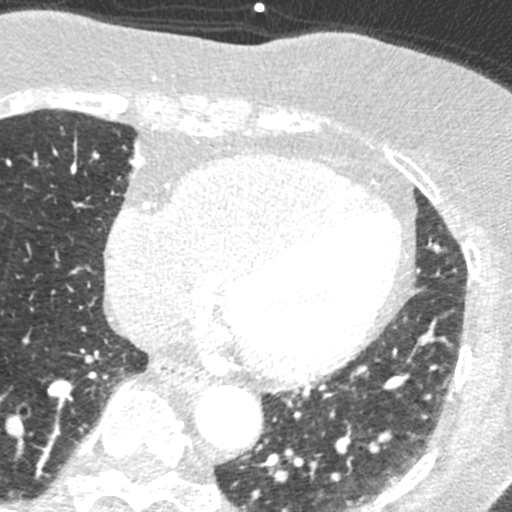
[im 182/273  lung]
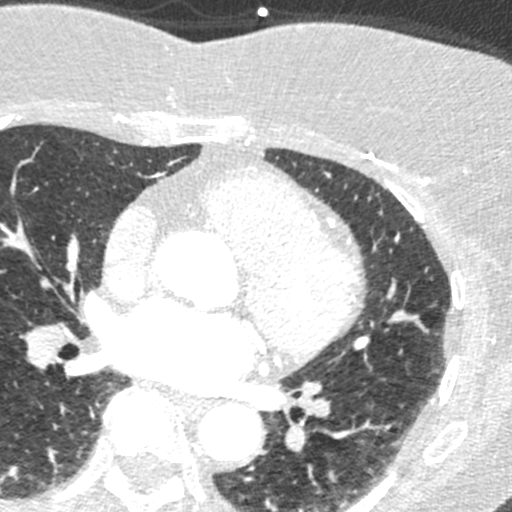

[8 of 20 positions shown; findings below may reference images not displayed]

FINDINGS: Atherosclerotic calcifications in the thoracic aorta. Within the
visualized portions of the thorax there are no suspicious appearing
pulmonary nodules or masses, there is no acute consolidative
airspace disease, no pleural effusions, no pneumothorax and no
lymphadenopathy. Visualized portions of the upper abdomen are
unremarkable. There are no aggressive appearing lytic or blastic
lesions noted in the visualized portions of the skeleton.
IMPRESSION: 1.  Aortic Atherosclerosis (QL2A3-STY.Y).
FINDINGS: Quality: Good, HR 67

Coronary calcium score: The patient's coronary artery calcium score
is 68.8, which places the patient in the 71st percentile.

Coronary arteries: Normal coronary origins.  Right dominance.

Right Coronary Artery: Dominant. No significant disease. Large acute
marginal branch without disease.

Left Main Coronary Artery: Minimal mixed 1-24% stenosis ([id number]).
Bifurcates as usual into the LAD and LCx arteries.

Left Anterior Descending Coronary Artery: Anterior vessel that wraps
around the apex. There is minimal non-obstructive CAD. 2 smaller
diagonal branches without disease.

Left Circumflex Artery: AV groove vessel with minimal mixed 1-24%
proximal stenosis ([id number]). Large proximal OM1 branch without
disease.

Aorta: Normal size, 28 mm at the mid ascending aorta (level of the
PA bifurcation) measured double oblique. Aortic atherosclerosis. No
dissection.

Aortic Valve: Trileaflet.  Annular calcification is noted.

Other findings:

Normal pulmonary vein drainage into the left atrium.

Normal left atrial appendage without a thrombus.

Normal size of the pulmonary artery.
IMPRESSION: 1. Minimal mixed non-obstructive CAD, CADRADS = 1.

2. Coronary calcium score of 68.8. This was 71st percentile for age
and sex matched control.

3. Normal coronary origin with right dominance.

4. Trileaflet aortic valve with annular calcification

5. Aortic atherosclerosis.

6. Continued cardiovascular risk reduction is recommended.

*** End of Addendum ***
EXAM:
OVER-READ INTERPRETATION  CT CHEST

The following report is an over-read performed by radiologist Dr.
Yehikon Youthfashion [REDACTED] on 05/16/2021. This
over-read does not include interpretation of cardiac or coronary
anatomy or pathology. The coronary calcium score/coronary CTA
interpretation by the cardiologist is attached.
FINDINGS: Atherosclerotic calcifications in the thoracic aorta. Within the
visualized portions of the thorax there are no suspicious appearing
pulmonary nodules or masses, there is no acute consolidative
airspace disease, no pleural effusions, no pneumothorax and no
lymphadenopathy. Visualized portions of the upper abdomen are
unremarkable. There are no aggressive appearing lytic or blastic
lesions noted in the visualized portions of the skeleton.
IMPRESSION: 1.  Aortic Atherosclerosis (QL2A3-STY.Y).

## 2022-02-04 DIAGNOSIS — B829 Intestinal parasitism, unspecified: Secondary | ICD-10-CM | POA: Diagnosis not present

## 2022-02-06 DIAGNOSIS — N3281 Overactive bladder: Secondary | ICD-10-CM | POA: Diagnosis not present

## 2022-02-06 DIAGNOSIS — Z6829 Body mass index (BMI) 29.0-29.9, adult: Secondary | ICD-10-CM | POA: Diagnosis not present

## 2022-04-07 DIAGNOSIS — R300 Dysuria: Secondary | ICD-10-CM | POA: Diagnosis not present

## 2022-04-15 DIAGNOSIS — Z6828 Body mass index (BMI) 28.0-28.9, adult: Secondary | ICD-10-CM | POA: Diagnosis not present

## 2022-04-15 DIAGNOSIS — R300 Dysuria: Secondary | ICD-10-CM | POA: Diagnosis not present

## 2022-04-15 DIAGNOSIS — N3281 Overactive bladder: Secondary | ICD-10-CM | POA: Diagnosis not present

## 2022-10-07 DIAGNOSIS — E785 Hyperlipidemia, unspecified: Secondary | ICD-10-CM | POA: Diagnosis not present

## 2022-10-07 DIAGNOSIS — Z1331 Encounter for screening for depression: Secondary | ICD-10-CM | POA: Diagnosis not present

## 2022-10-07 DIAGNOSIS — Z0000 Encounter for general adult medical examination without abnormal findings: Secondary | ICD-10-CM | POA: Diagnosis not present

## 2022-10-07 DIAGNOSIS — Z6828 Body mass index (BMI) 28.0-28.9, adult: Secondary | ICD-10-CM | POA: Diagnosis not present

## 2022-10-07 LAB — HEPATIC FUNCTION PANEL
ALT: 13 U/L (ref 7–35)
AST: 14 (ref 13–35)
Alkaline Phosphatase: 67 (ref 25–125)
Bilirubin, Total: 0.5

## 2022-10-07 LAB — CBC AND DIFFERENTIAL
HCT: 40 (ref 36–46)
Hemoglobin: 14 (ref 12.0–16.0)
Platelets: 300 10*3/uL (ref 150–400)
WBC: 7.7

## 2022-10-07 LAB — BASIC METABOLIC PANEL
BUN: 15 (ref 4–21)
CO2: 27 — AB (ref 13–22)
Chloride: 103 (ref 99–108)
Creatinine: 0.7 (ref 0.5–1.1)
Glucose: 85
Potassium: 4.3 mEq/L (ref 3.5–5.1)
Sodium: 140 (ref 137–147)

## 2022-10-07 LAB — CBC: RBC: 4.79 (ref 3.87–5.11)

## 2022-10-07 LAB — COMPREHENSIVE METABOLIC PANEL
Albumin: 4.1 (ref 3.5–5.0)
Calcium: 9.8 (ref 8.7–10.7)

## 2022-11-02 DIAGNOSIS — R062 Wheezing: Secondary | ICD-10-CM | POA: Diagnosis not present

## 2022-11-02 DIAGNOSIS — R0981 Nasal congestion: Secondary | ICD-10-CM | POA: Diagnosis not present

## 2022-11-02 DIAGNOSIS — R070 Pain in throat: Secondary | ICD-10-CM | POA: Diagnosis not present

## 2022-11-30 NOTE — Progress Notes (Signed)
[name]  [address] [place],  [place]  [address] [phone]  Clinic Day:  12/02/22   Referring physician: Ronita Hipps, MD   CHIEF COMPLAINT:  CC: History of stage IIB hormone receptor positive breast cancer  Current Treatment:  Surveillance   HISTORY OF PRESENT ILLNESS:  Sarah Hubbard is a 72 y.o. 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 We began seeing her in March 2016, when she relocated to the area.  She was treated with bilateral mastectomies and reconstruction in [place].  Pathology revealed 2 lesions in the left breast measuring 2.5 cm and 1.1 cm respectively, which were grade 2, invasive lobular carcinoma. 1 of 4 lymph nodes were positive.  Pathology of the right breast was benign.  Estrogen receptors were positive, progesterone receptors negative and her 2 Neu negative.  She had surgical wound dehiscence of the reconstruction and abdominal wounds, but eventually healed.  She received adjuvant chemotherapy with dose dense doxorubicin and cyclophosphamide for 4 cycles, followed by dose dense paclitaxel for 4 cycles.  Chemotherapy was completed in August 2015.  She received adjuvant radiation to the left reconstruction and axilla completed in October 2015.  She was then placed on anastrozole 1 mg daily in November 2015.  Due to her personal and family history she states she saw a genetic counselor regarding testing for hereditary breast and ovarian cancer.  Her mother had breast cancer in her 44's and a maternal grandmother had lung cancer.  As the patient's daughter had already undergone testing for hereditary breast and ovarian cancer, which was negative, the patient did not wish to have testing.  Bone density scan was in October 2017 and revealed osteopenia, with a T-score of -1.8 of the femur and -0.7 of the spine.  The bone density had previously been  normal with a T-score of -0.6 of the femur.  Due to the osteopenia while on anastrozole, she was instructed to take calcium/vitamin-D twice daily and placed on Prolia injections every 6 months in January 2018.  She had removal of polyps on previous colonoscopy, so Dr. Helene Kelp had her see Dr. Melina Copa.  He recommended repeat colonoscopy in 5 years.  Bone density scan in October 2019 revealed improvement in her osteopenia with a T-score of -0.9 in the spine and a T-score -1.6 in the femur.  At her visit in January 2020, she was concerned about a tender lump under her left breast reconstruction.  There was some firmness of the inferior left reconstruction.  She therefore underwent a left diagnostic mammogram and ultrasound, which did not reveal any abnormality.  She stopped her anastrozole in January 2021.  Bone density scan from October 2021 revealed osteopenia with a T-score of -1.6 of the right femur neck, stable.  Dual femur total mean is normal at -1.0, previously -1.1.  AP spine is normal at -0.5, previously -0.9.  With this good improvement, Prolia was discontinued in January 2022.  INTERVAL HISTORY:  Sarah Hubbard is here for annual follow up for her history of stage IIB hormone receptor positive breast cancer in February, 2015. She had COVID in December, 2023 but has recovered. She states that she is feeling well but the only thing bothersome is her scar tissue of the left reconstruction. Her CBC and chemistries from 2023 are unremarkable. She is also due for a current bone density scan since her last one was since October, 2021  and will schedule it with [facility]. She will continue her annual follow-up appointments. She denies signs of infection such as sore throat, sinus drainage, cough, or urinary symptoms.  She denies fevers or recurrent chills. She denies pain. She denies nausea, vomiting, chest pain, dyspnea or cough. Her weight has been stable.   REVIEW OF SYSTEMS:  Review of Systems   Constitutional: Negative.  Negative for appetite change, chills, diaphoresis, fatigue, fever and unexpected weight change.  HENT:  Negative.  Negative for hearing loss, lump/mass, mouth sores, nosebleeds, sore throat, tinnitus, trouble swallowing and voice change.   Eyes: Negative.  Negative for eye problems and icterus.  Respiratory: Negative.  Negative for chest tightness, cough, hemoptysis, shortness of breath and wheezing.   Cardiovascular: Negative.  Negative for chest pain, leg swelling and palpitations.  Gastrointestinal: Negative.  Negative for abdominal distention, abdominal pain, blood in stool, constipation, diarrhea, nausea, rectal pain and vomiting.  Endocrine: Negative.  Negative for hot flashes.  Genitourinary: Negative.  Negative for bladder incontinence, difficulty urinating, dyspareunia, dysuria, frequency, hematuria, menstrual problem, nocturia, pelvic pain, vaginal bleeding and vaginal discharge.   Musculoskeletal: Negative.  Negative for arthralgias, back pain, flank pain, gait problem, myalgias, neck pain and neck stiffness.  Skin: Negative.  Negative for itching, rash and wound.  Neurological: Negative.  Negative for dizziness, extremity weakness, gait problem, headaches, light-headedness, numbness, seizures and speech difficulty.  Hematological: Negative.  Negative for adenopathy. Does not bruise/bleed easily.  Psychiatric/Behavioral: Negative.  Negative for confusion, decreased concentration, depression, sleep disturbance and suicidal ideas. The patient is not nervous/anxious.      VITALS:  Blood pressure (!) 147/65, pulse 64, temperature (!) 97.5 F (36.4 C), temperature source Oral, resp. rate 16, height 5' (1.524 m), weight 155 lb 11.2 oz (70.6 kg), SpO2 98 %.  Wt Readings from Last 3 Encounters:  12/07/22 158 lb 3.2 oz (71.8 kg)  12/02/22 155 lb 11.2 oz (70.6 kg)  12/12/21 155 lb (70.3 kg)    Body mass index is 30.41 kg/m.  Performance status (ECOG): 0 -  Asymptomatic  PHYSICAL EXAM:  Physical Exam Vitals and nursing note reviewed.  Constitutional:      General: She is not in acute distress.    Appearance: Normal appearance. She is normal weight. She is not ill-appearing, toxic-appearing or diaphoretic.  HENT:     Head: Normocephalic and atraumatic.     Right Ear: Tympanic membrane, ear canal and external ear normal. There is no impacted cerumen.     Left Ear: Tympanic membrane, ear canal and external ear normal. There is no impacted cerumen.     Nose: Nose normal. No congestion or rhinorrhea.     Mouth/Throat:     Mouth: Mucous membranes are moist.     Pharynx: Oropharynx is clear. No oropharyngeal exudate or posterior oropharyngeal erythema.  Eyes:     General: No scleral icterus.       Right eye: No discharge.        Left eye: No discharge.     Extraocular Movements: Extraocular movements intact.     Conjunctiva/sclera: Conjunctivae normal.     Pupils: Pupils are equal, round, and reactive to light.  Neck:     Vascular: No carotid bruit.  Cardiovascular:     Rate and Rhythm: Normal rate and regular rhythm.     Pulses: Normal pulses.     Heart sounds: Normal heart sounds. No murmur heard.    No friction rub. No gallop.  Pulmonary:  Effort: Pulmonary effort is normal. No respiratory distress.     Breath sounds: Normal breath sounds. No stridor. No wheezing, rhonchi or rales.  Chest:     Chest wall: No tenderness.     Comments: Bilateral reconstructions are negative. Excess scar tissue in the inferior left reconstructed breast. Abdominal:     General: Bowel sounds are normal. There is no distension.     Palpations: Abdomen is soft. There is no hepatomegaly, splenomegaly or mass.     Tenderness: There is no abdominal tenderness. There is no right CVA tenderness, left CVA tenderness, guarding or rebound.     Hernia: No hernia is present.  Musculoskeletal:        General: No swelling, tenderness, deformity or signs of injury.  Normal range of motion.     Cervical back: Normal range of motion and neck supple. No rigidity or tenderness.     Right lower leg: No edema.     Left lower leg: No edema.  Lymphadenopathy:     Cervical: No cervical adenopathy.  Skin:    General: Skin is warm and dry.     Coloration: Skin is not jaundiced or pale.     Findings: No bruising, erythema, lesion or rash.  Neurological:     General: No focal deficit present.     Mental Status: She is alert and oriented to person, place, and time. Mental status is at baseline.     Cranial Nerves: No cranial nerve deficit.     Sensory: No sensory deficit.     Motor: No weakness.     Coordination: Coordination normal.     Gait: Gait normal.     Deep Tendon Reflexes: Reflexes normal.  Psychiatric:        Mood and Affect: Mood normal.        Behavior: Behavior normal.        Thought Content: Thought content normal.        Judgment: Judgment normal.    LABS:      Latest Ref Rng & Units 10/07/2022   12:55 PM 11/12/2020   12:00 AM  CBC  WBC  7.7     6.8  C     Hemoglobin 12.0 - 16.0 14.0     13.8      Hematocrit 36 - 46 40     41      Platelets 150 - 400 K/uL 300     265  C       C Corrected result   This result is from an external source.      Latest Ref Rng & Units 12/07/2022    1:31 PM 10/07/2022   12:55 PM 05/21/2021    8:46 AM  CMP  Glucose 70 - 99 mg/dL 123     BUN 8 - 27 mg/dL 11  15       Creatinine 0.57 - 1.00 mg/dL 0.87  0.7       Sodium 134 - 144 mmol/L 145  140       Potassium 3.5 - 5.2 mmol/L 4.1  4.3       Chloride 96 - 106 mmol/L 106  103       CO2 20 - 29 mmol/L 24  27       Calcium 8.7 - 10.3 mg/dL 9.7  9.8       Total Protein 6.0 - 8.5 g/dL 6.7   6.9   Total Bilirubin 0.0 - 1.2 mg/dL <0.2  0.3   Alkaline Phos 44 - 121 IU/L 76  67     64   AST 0 - 40 IU/L '17  14     14   '$ ALT 0 - 32 IU/L '17  13     13      '$ This result is from an external source.   STUDIES:  No studies found   Allergies:  Allergies   Allergen Reactions   Antihistamines, Diphenhydramine-Type    Codeine     Upset stomach    Current Medications: Current Outpatient Medications  Medication Sig Dispense Refill   rosuvastatin (CRESTOR) 10 MG tablet Take 1 tablet (10 mg total) by mouth daily. 90 tablet 3   aspirin EC 81 MG tablet Take 81 mg by mouth daily. Swallow whole.     calcium-vitamin D (OSCAL WITH D) 500-200 MG-UNIT tablet Take 1 tablet by mouth daily.     Cholecalciferol 50 MCG (2000 UT) CAPS Take 2,000 Units by mouth daily.     losartan (COZAAR) 25 MG tablet Take 25 mg by mouth daily.     nitroGLYCERIN (NITROSTAT) 0.4 MG SL tablet Place 0.4 mg under the tongue every 5 (five) minutes as needed for chest pain.     Omega-3 1000 MG CAPS Take 1,000 mg by mouth daily.     No current facility-administered medications for this visit.     ASSESSMENT & PLAN:   Assessment:   1. History of IIB hormone receptor positive breast cancer, nearly 9 years ago.  She remains without evidence of recurrence.  She completed 5+ years of anastrozole in January 2021.  2. Osteopenia, for which she was on Prolia every 6 months, in addition to calcium and vitamin-D.  Recent bone density shows good improvement so we discontinued her Prolia. She knows to continue with daily calcium and vitamin D.  She will be due for repeat bone density now.  Plan: She is due for a current bone density scan and will schedule it with [facility].  She will continue her annual follow-up appointments. She has her labs through Dr. Helene Kelp. The patient understands the plans discussed today and is in agreement with them.  She knows to contact our office if she develops concerns regarding her breast cancer or its treatment.  I provided 20 minutes of face-to-face time during this this encounter and > 50% was spent counseling as documented under my assessment and plan.    Derwood Kaplan, MD [place] [facility] [address] [place] [address] Dept: [phone] Dept Fax: [phone]     Sumner Boast Lassiter,acting as a scribe for Derwood Kaplan, MD.,have documented all relevant documentation on the behalf of Derwood Kaplan, MD,as directed by  Derwood Kaplan, MD while in the presence of Derwood Kaplan, MD.

## 2022-12-02 ENCOUNTER — Encounter: Payer: Self-pay | Admitting: Oncology

## 2022-12-02 ENCOUNTER — Inpatient Hospital Stay: Payer: Medicare Other | Attending: Oncology | Admitting: Oncology

## 2022-12-02 ENCOUNTER — Telehealth: Payer: Self-pay | Admitting: Oncology

## 2022-12-02 VITALS — BP 147/65 | HR 64 | Temp 97.5°F | Resp 16 | Ht 60.0 in | Wt 155.7 lb

## 2022-12-02 DIAGNOSIS — Z170 Estrogen receptor positive status [ER+]: Secondary | ICD-10-CM | POA: Diagnosis not present

## 2022-12-02 DIAGNOSIS — Z780 Asymptomatic menopausal state: Secondary | ICD-10-CM

## 2022-12-02 DIAGNOSIS — C50412 Malignant neoplasm of upper-outer quadrant of left female breast: Secondary | ICD-10-CM | POA: Diagnosis not present

## 2022-12-02 DIAGNOSIS — M8580 Other specified disorders of bone density and structure, unspecified site: Secondary | ICD-10-CM

## 2022-12-02 NOTE — Telephone Encounter (Signed)
Patient has been scheduled for follow-up visit per 12/02/22 LOS.  Pt given an appt calendar with date and time.   Dexa order faxed to [facility] for schedulers.

## 2022-12-07 ENCOUNTER — Ambulatory Visit: Payer: Medicare Other | Attending: Cardiology | Admitting: Cardiology

## 2022-12-07 VITALS — BP 138/58 | HR 76 | Ht 60.0 in | Wt 158.2 lb

## 2022-12-07 DIAGNOSIS — I10 Essential (primary) hypertension: Secondary | ICD-10-CM

## 2022-12-07 DIAGNOSIS — I2510 Atherosclerotic heart disease of native coronary artery without angina pectoris: Secondary | ICD-10-CM

## 2022-12-07 DIAGNOSIS — E66811 Obesity, class 1: Secondary | ICD-10-CM | POA: Insufficient documentation

## 2022-12-07 DIAGNOSIS — E669 Obesity, unspecified: Secondary | ICD-10-CM | POA: Diagnosis not present

## 2022-12-07 DIAGNOSIS — E782 Mixed hyperlipidemia: Secondary | ICD-10-CM | POA: Insufficient documentation

## 2022-12-07 HISTORY — DX: Obesity, class 1: E66.811

## 2022-12-07 NOTE — Progress Notes (Signed)
Cardiology Office Note:    Date:  12/07/2022   ID:  Sarah Hubbard, DOB 05/08/1951, MRN [id number]  PCP:  Ronita Hipps, MD  Cardiologist:  Jenean Lindau, MD   Referring MD: Ronita Hipps, MD    ASSESSMENT:    1. Atherosclerosis of native coronary artery of native heart without angina pectoris   2. Essential hypertension   3. Mixed dyslipidemia   4. Obesity (BMI 30.0-34.9)    PLAN:    In order of problems listed above:  Coronary artery disease: Secondary prevention stressed with the patient.  Importance of compliance with diet medication stressed and she vocalized understanding.  She was advised to walk at least half an hour a day 5 days a week and she promises to do so. Essential hypertension: Blood pressure stable and lifestyle modification urged diet emphasized Mixed dyslipidemia: I will check a Chem-7 LFTs today.  Her LFTs were fine in the past.  Her lipids are not at goal and I would like to switch her to Crestor 10 mg daily I will look at the blood work and decide accordingly.  If we do this she will be back in 6 weeks for liver lipid check.  Diet emphasized.  She promises to do better. Obesity: Weight reduction stressed and she promises to do better.  Risks of obesity. Patient will be seen in follow-up appointment in 12 months or earlier if the patient has any concerns    Medication Adjustments/Labs and Tests Ordered: Current medicines are reviewed at length with the patient today.  Concerns regarding medicines are outlined above.  No orders of the defined types were placed in this encounter.  No orders of the defined types were placed in this encounter.    No chief complaint on file.    History of Present Illness:    Sarah Hubbard is a 72 y.o. female.  Patient has past medical history of coronary arteriosclerosis, essential hypertension, dyslipidemia and obesity.  She is walking on a regular basis but probably not enough.  No chest pain  orthopnea or PND.  At the time of my evaluation, the patient is alert awake oriented and in no distress.  Past Medical History:  Diagnosis Date   Angina pectoris ([place]) 05/07/2021   BMI 28.0-28.9,adult    Bruit    Chest pressure    Coronary atherosclerosis 12/12/2021   Essential hypertension 05/07/2021   Fatty liver    Hearing loss    Hyperlipidemia    Hypertension    Malignant neoplasm of upper-outer quadrant of left female breast ([place])    Mixed dyslipidemia 05/07/2021   Osteoarthritis of knee 05/11/2018   Osteopenia after menopause 12/01/2021   Prolia stopped in 2022   Other specified disorders of bone density and structure, multiple sites 11/01/2020   Palpitations    Thyroid nodule    Trochanteric bursitis of left hip 05/31/2018    Past Surgical History:  Procedure Laterality Date   BREAST RECONSTRUCTION  2015   LEEP     X2   MASTECTOMY     TUBAL LIGATION  1979    Current Medications: Current Meds  Medication Sig   aspirin EC 81 MG tablet Take 81 mg by mouth daily. Swallow whole.   calcium-vitamin D (OSCAL WITH D) 500-200 MG-UNIT tablet Take 1 tablet by mouth daily.   Cholecalciferol 50 MCG (2000 UT) CAPS Take 2,000 Units by mouth daily.   losartan (COZAAR) 25 MG tablet Take 25 mg by mouth daily.  nitroGLYCERIN (NITROSTAT) 0.4 MG SL tablet Place 0.4 mg under the tongue every 5 (five) minutes as needed for chest pain.   Omega-3 1000 MG CAPS Take 1,000 mg by mouth daily.   pravastatin (PRAVACHOL) 10 MG tablet Take 10 mg by mouth daily.     Allergies:   Antihistamines, diphenhydramine-type and Codeine   Social History   Socioeconomic History   Marital status: Divorced    Spouse name: Not on file   Number of children: Not on file   Years of education: Not on file   Highest education level: Not on file  Occupational History   Not on file  Tobacco Use   Smoking status: Former    Packs/day: 2.00    Years: 32.00    Total pack years: 64.00    Types: Cigarettes    Smokeless tobacco: Never   Tobacco comments:    quit 2003  Substance and Sexual Activity   Alcohol use: Not Currently   Drug use: Never   Sexual activity: Not Currently  Other Topics Concern   Not on file  Social History Narrative   Not on file   Social Determinants of Health   Financial Resource Strain: Not on file  Food Insecurity: Not on file  Transportation Needs: Not on file  Physical Activity: Not on file  Stress: Not on file  Social Connections: Not on file     Family History: The patient's family history includes Bone cancer (age of onset: 21) in her half-sister; Breast cancer in her mother; Kidney cancer in her half-brother; Mesothelioma in her father.  ROS:   Please see the history of present illness.    All other systems reviewed and are negative.  EKGs/Labs/Other Studies Reviewed:    The following studies were reviewed today: EKG reveals sinus rhythm and within normal limits.   Recent Labs: 10/07/2022: ALT 13; BUN 15; Creatinine 0.7; Hemoglobin 14.0; Platelets 300; Potassium 4.3; Sodium 140  Recent Lipid Panel    Component Value Date/Time   CHOL 208 (H) 05/21/2021 0846   TRIG 114 05/21/2021 0846   HDL 53 05/21/2021 0846   CHOLHDL 3.9 05/21/2021 0846   LDLCALC 135 (H) 05/21/2021 0846    Physical Exam:    VS:  BP (!) 138/58   Pulse 76   Ht 5' (1.524 m)   Wt 158 lb 3.2 oz (71.8 kg)   SpO2 97%   BMI 30.90 kg/m     Wt Readings from Last 3 Encounters:  12/07/22 158 lb 3.2 oz (71.8 kg)  12/02/22 155 lb 11.2 oz (70.6 kg)  12/12/21 155 lb (70.3 kg)     GEN: Patient is in no acute distress HEENT: Normal NECK: No JVD; No carotid bruits LYMPHATICS: No lymphadenopathy CARDIAC: Hear sounds regular, 2/6 systolic murmur at the apex. RESPIRATORY:  Clear to auscultation without rales, wheezing or rhonchi  ABDOMEN: Soft, non-tender, non-distended MUSCULOSKELETAL:  No edema; No deformity  SKIN: Warm and dry NEUROLOGIC:  Alert and oriented x  3 PSYCHIATRIC:  Normal affect   Signed, Jenean Lindau, MD  12/07/2022 1:20 PM    [facility]

## 2022-12-07 NOTE — Patient Instructions (Signed)
Medication Instructions:  Your physician recommends that you continue on your current medications as directed. Please refer to the Current Medication list given to you today.  *If you need a refill on your cardiac medications before your next appointment, please call your pharmacy*   Lab Work: Your physician recommends that you have a CMP done today.  If you have labs (blood work) drawn today and your tests are completely normal, you will receive your results only by: [place] (if you have MyChart) OR A paper copy in the mail If you have any lab test that is abnormal or we need to change your treatment, we will call you to review the results.   Testing/Procedures: None ordered   Follow-Up: At [facility], you and your health needs are our priority.  As part of our continuing mission to provide you with exceptional heart care, we have created designated Provider Care Teams.  These Care Teams include your primary Cardiologist (physician) and Advanced Practice Providers (APPs -  Physician Assistants and Nurse Practitioners) who all work together to provide you with the care you need, when you need it.  We recommend signing up for the patient portal called "MyChart".  Sign up information is provided on this After Visit Summary.  MyChart is used to connect with patients for Virtual Visits (Telemedicine).  Patients are able to view lab/test results, encounter notes, upcoming appointments, etc.  Non-urgent messages can be sent to your provider as well.   To learn more about what you can do with MyChart, go to [email or web address].    Your next appointment:   12 month(s)  The format for your next appointment:   In Person  Provider:   Jyl Heinz, MD    Other Instructions none  Important Information About Sugar

## 2022-12-08 LAB — COMPREHENSIVE METABOLIC PANEL
ALT: 17 IU/L (ref 0–32)
AST: 17 IU/L (ref 0–40)
Albumin/Globulin Ratio: 1.6 (ref 1.2–2.2)
Albumin: 4.1 g/dL (ref 3.9–4.9)
Alkaline Phosphatase: 76 IU/L (ref 44–121)
BUN/Creatinine Ratio: 13 (ref 12–28)
BUN: 11 mg/dL (ref 8–27)
Bilirubin Total: <0.2 mg/dL (ref 0.0–1.2)
CO2: 24 mmol/L (ref 20–29)
Calcium: 9.7 mg/dL (ref 8.7–10.3)
Chloride: 106 mmol/L (ref 96–106)
Creatinine, Ser: 0.87 mg/dL (ref 0.57–1.00)
Globulin, Total: 2.6 g/dL (ref 1.5–4.5)
Glucose: 123 mg/dL — ABNORMAL HIGH (ref 70–99)
Potassium: 4.1 mmol/L (ref 3.5–5.2)
Sodium: 145 mmol/L — ABNORMAL HIGH (ref 134–144)
Total Protein: 6.7 g/dL (ref 6.0–8.5)
eGFR: 72 mL/min/{1.73_m2} (ref >59)

## 2022-12-09 ENCOUNTER — Telehealth: Payer: Self-pay

## 2022-12-09 DIAGNOSIS — I2510 Atherosclerotic heart disease of native coronary artery without angina pectoris: Secondary | ICD-10-CM

## 2022-12-09 DIAGNOSIS — E782 Mixed hyperlipidemia: Secondary | ICD-10-CM

## 2022-12-09 MED ORDER — ROSUVASTATIN CALCIUM 10 MG PO TABS: 10.0000 mg | ORAL_TABLET | Freq: Every day | ORAL | 3 refills | Status: DC

## 2022-12-09 NOTE — Telephone Encounter (Signed)
-----  Message from Jenean Lindau, MD sent at 12/08/2022 12:44 PM EST ----- Please change statin to Crestor 10 mg daily and liver lipid check in 6 weeks.  Copy primary care Jenean Lindau, MD 12/08/2022 12:44 PM

## 2022-12-18 ENCOUNTER — Telehealth: Payer: Self-pay

## 2022-12-18 NOTE — Telephone Encounter (Signed)
-----  Message from Derwood Kaplan, MD sent at 12/18/2022  7:08 AM EST ----- Regarding: call Tell her labs look good.  (Did we do a CBC?)

## 2022-12-18 NOTE — Telephone Encounter (Signed)
Patient notified of lab results

## 2023-04-26 DIAGNOSIS — H25813 Combined forms of age-related cataract, bilateral: Secondary | ICD-10-CM | POA: Diagnosis not present

## 2023-05-05 DIAGNOSIS — Z01818 Encounter for other preprocedural examination: Secondary | ICD-10-CM | POA: Diagnosis not present

## 2023-05-05 DIAGNOSIS — H52222 Regular astigmatism, left eye: Secondary | ICD-10-CM | POA: Diagnosis not present

## 2023-05-05 DIAGNOSIS — H25812 Combined forms of age-related cataract, left eye: Secondary | ICD-10-CM | POA: Diagnosis not present

## 2023-05-05 DIAGNOSIS — H25813 Combined forms of age-related cataract, bilateral: Secondary | ICD-10-CM | POA: Diagnosis not present

## 2023-05-19 DIAGNOSIS — H25812 Combined forms of age-related cataract, left eye: Secondary | ICD-10-CM | POA: Diagnosis not present

## 2023-05-19 DIAGNOSIS — H269 Unspecified cataract: Secondary | ICD-10-CM | POA: Diagnosis not present

## 2023-05-19 DIAGNOSIS — H2512 Age-related nuclear cataract, left eye: Secondary | ICD-10-CM | POA: Diagnosis not present

## 2023-05-31 DIAGNOSIS — H269 Unspecified cataract: Secondary | ICD-10-CM | POA: Diagnosis not present

## 2023-05-31 DIAGNOSIS — H2511 Age-related nuclear cataract, right eye: Secondary | ICD-10-CM | POA: Diagnosis not present

## 2023-05-31 DIAGNOSIS — H25811 Combined forms of age-related cataract, right eye: Secondary | ICD-10-CM | POA: Diagnosis not present

## 2023-08-16 DIAGNOSIS — Z780 Asymptomatic menopausal state: Secondary | ICD-10-CM | POA: Diagnosis not present

## 2023-09-09 DIAGNOSIS — H9319 Tinnitus, unspecified ear: Secondary | ICD-10-CM | POA: Diagnosis not present

## 2023-09-09 DIAGNOSIS — N3000 Acute cystitis without hematuria: Secondary | ICD-10-CM | POA: Diagnosis not present

## 2023-09-09 DIAGNOSIS — N3091 Cystitis, unspecified with hematuria: Secondary | ICD-10-CM | POA: Diagnosis not present

## 2023-09-09 DIAGNOSIS — H903 Sensorineural hearing loss, bilateral: Secondary | ICD-10-CM | POA: Diagnosis not present

## 2023-10-12 DIAGNOSIS — Z1331 Encounter for screening for depression: Secondary | ICD-10-CM | POA: Diagnosis not present

## 2023-10-12 DIAGNOSIS — Z0000 Encounter for general adult medical examination without abnormal findings: Secondary | ICD-10-CM | POA: Diagnosis not present

## 2023-10-12 DIAGNOSIS — Z79899 Other long term (current) drug therapy: Secondary | ICD-10-CM | POA: Diagnosis not present

## 2023-10-12 DIAGNOSIS — Z77090 Contact with and (suspected) exposure to asbestos: Secondary | ICD-10-CM | POA: Diagnosis not present

## 2023-10-12 DIAGNOSIS — I10 Essential (primary) hypertension: Secondary | ICD-10-CM | POA: Diagnosis not present

## 2023-10-12 DIAGNOSIS — E785 Hyperlipidemia, unspecified: Secondary | ICD-10-CM | POA: Diagnosis not present

## 2023-10-12 DIAGNOSIS — Z6828 Body mass index (BMI) 28.0-28.9, adult: Secondary | ICD-10-CM | POA: Diagnosis not present

## 2023-10-12 DIAGNOSIS — Z1339 Encounter for screening examination for other mental health and behavioral disorders: Secondary | ICD-10-CM | POA: Diagnosis not present

## 2023-10-13 ENCOUNTER — Telehealth: Payer: Self-pay

## 2023-10-13 DIAGNOSIS — Z0000 Encounter for general adult medical examination without abnormal findings: Secondary | ICD-10-CM | POA: Diagnosis not present

## 2023-10-13 NOTE — Telephone Encounter (Signed)
Records sent via [email or web address]

## 2023-11-15 DIAGNOSIS — Z87891 Personal history of nicotine dependence: Secondary | ICD-10-CM | POA: Diagnosis not present

## 2023-11-15 DIAGNOSIS — Z8616 Personal history of COVID-19: Secondary | ICD-10-CM | POA: Diagnosis not present

## 2023-11-15 DIAGNOSIS — J4530 Mild persistent asthma, uncomplicated: Secondary | ICD-10-CM | POA: Diagnosis not present

## 2023-12-03 ENCOUNTER — Ambulatory Visit: Payer: Medicare Other | Admitting: Oncology

## 2023-12-06 ENCOUNTER — Ambulatory Visit: Payer: Medicare Other | Attending: Cardiology | Admitting: Cardiology

## 2023-12-06 ENCOUNTER — Other Ambulatory Visit: Payer: Self-pay

## 2023-12-06 VITALS — BP 148/60 | HR 62 | Ht 60.0 in | Wt 156.2 lb

## 2023-12-06 DIAGNOSIS — I10 Essential (primary) hypertension: Secondary | ICD-10-CM | POA: Insufficient documentation

## 2023-12-06 DIAGNOSIS — I2510 Atherosclerotic heart disease of native coronary artery without angina pectoris: Secondary | ICD-10-CM | POA: Diagnosis not present

## 2023-12-06 DIAGNOSIS — E66811 Obesity, class 1: Secondary | ICD-10-CM | POA: Diagnosis not present

## 2023-12-06 DIAGNOSIS — E782 Mixed hyperlipidemia: Secondary | ICD-10-CM | POA: Insufficient documentation

## 2023-12-06 NOTE — Progress Notes (Signed)
 Cardiology Office Note:    Date:  12/06/2023   ID:  Sarah Hubbard, DOB 01-Mar-1951, MRN [id number]  PCP:  Ina Marcellus RAMAN, MD  Cardiologist:  Jennifer JONELLE Crape, MD   Referring MD: Ina Marcellus RAMAN, MD    ASSESSMENT:    1. Essential hypertension   2. Atherosclerosis of native coronary artery of native heart without angina pectoris   3. Mixed hyperlipidemia   4. Obesity (BMI 30.0-34.9)    PLAN:    In order of problems listed above:  Coronary atherosclerosis: Secondary prevention stressed with the patient.  Importance of compliance with diet medication stressed and she vocalized understanding.  She is doing very well with exercise and I congratulated her about this. Essential hypertension: Blood pressure stable and diet was emphasized.  She has an element of whitecoat hypertension. Mixed dyslipidemia: On lipid-lowering medications followed by primary care.  She will be back tomorrow morning for blood work and we will advise her accordingly.  Goal LDL less than 70. Obesity: Weight reduction stressed diet emphasized risks of obesity explained and she promises to do better.  Follow-up appointment in 1 year or earlier if she has any concerns.   Medication Adjustments/Labs and Tests Ordered: Current medicines are reviewed at length with the patient today.  Concerns regarding medicines are outlined above.  Orders Placed This Encounter  Procedures   Comprehensive metabolic panel   Lipid panel   No orders of the defined types were placed in this encounter.    No chief complaint on file.    History of Present Illness:    Sarah Hubbard is a 73 y.o. female.  Patient has past medical history of Neri atherosclerosis, essential hypertension and mixed dyslipidemia.  She tells me that she walks 30 minutes a day without any problems.  She denies any problems at this time and takes care of activities of daily living.  No chest pain orthopnea or PND.  At the time of my  evaluation, the patient is alert awake oriented and in no distress.  Past Medical History:  Diagnosis Date   Angina pectoris (HCC) 05/07/2021   BMI 28.0-28.9,adult    Bruit    Chest pressure    Coronary atherosclerosis 12/12/2021   Essential hypertension 05/07/2021   Fatty liver    Hearing loss    Hyperlipidemia    Hypertension    Malignant neoplasm of upper-outer quadrant of left female breast (HCC)    Mixed dyslipidemia 05/07/2021   Obesity (BMI 30.0-34.9) 12/07/2022   Osteoarthritis of knee 05/11/2018   Osteopenia after menopause 12/01/2021   Prolia  stopped in 2022   Other specified disorders of bone density and structure, multiple sites 11/01/2020   Palpitations    Thyroid  nodule    Trochanteric bursitis of left hip 05/31/2018    Past Surgical History:  Procedure Laterality Date   BREAST RECONSTRUCTION  2015   LEEP     X2   MASTECTOMY     TUBAL LIGATION  1979    Current Medications: Current Meds  Medication Sig   aspirin EC 81 MG tablet Take 81 mg by mouth daily. Swallow whole.   calcium -vitamin D (OSCAL WITH D) 500-200 MG-UNIT tablet Take 1 tablet by mouth daily.   Cholecalciferol 50 MCG (2000 UT) CAPS Take 2,000 Units by mouth daily.   losartan (COZAAR) 25 MG tablet Take 25 mg by mouth daily.   Omega-3 1000 MG CAPS Take 1,000 mg by mouth daily.   rosuvastatin  (CRESTOR ) 10 MG tablet Take  10 mg by mouth daily.   tolterodine (DETROL LA) 4 MG 24 hr capsule Take 4 mg by mouth daily.     Allergies:   Antihistamines, diphenhydramine-type and Codeine   Social History   Socioeconomic History   Marital status: Divorced    Spouse name: Not on file   Number of children: Not on file   Years of education: Not on file   Highest education level: Not on file  Occupational History   Not on file  Tobacco Use   Smoking status: Former    Current packs/day: 2.00    Average packs/day: 2.0 packs/day for 32.0 years (64.0 ttl pk-yrs)    Types: Cigarettes   Smokeless  tobacco: Never   Tobacco comments:    quit 2003  Substance and Sexual Activity   Alcohol use: Not Currently   Drug use: Never   Sexual activity: Not Currently  Other Topics Concern   Not on file  Social History Narrative   Not on file   Social Drivers of Health   Financial Resource Strain: Not on file  Food Insecurity: Not on file  Transportation Needs: Not on file  Physical Activity: Not on file  Stress: Not on file  Social Connections: Not on file     Family History: The patient's family history includes Bone cancer (age of onset: 50) in her half-sister; Breast cancer in her mother; Kidney cancer in her half-brother; Mesothelioma in her father.  ROS:   Please see the history of present illness.    All other systems reviewed and are negative.  EKGs/Labs/Other Studies Reviewed:    The following studies were reviewed today: I discussed my findings with the patient at length   Recent Labs: 12/07/2022: ALT 17; BUN 11; Creatinine, Ser 0.87; Potassium 4.1; Sodium 145  Recent Lipid Panel    Component Value Date/Time   CHOL 208 (H) 05/21/2021 0846   TRIG 114 05/21/2021 0846   HDL 53 05/21/2021 0846   CHOLHDL 3.9 05/21/2021 0846   LDLCALC 135 (H) 05/21/2021 0846    Physical Exam:    VS:  BP (!) 160/72   Pulse 62   Ht 5' (1.524 m)   Wt 156 lb 3.2 oz (70.9 kg)   SpO2 94%   BMI 30.51 kg/m     Wt Readings from Last 3 Encounters:  12/06/23 156 lb 3.2 oz (70.9 kg)  12/07/22 158 lb 3.2 oz (71.8 kg)  12/02/22 155 lb 11.2 oz (70.6 kg)     GEN: Patient is in no acute distress HEENT: Normal NECK: No JVD; No carotid bruits LYMPHATICS: No lymphadenopathy CARDIAC: Hear sounds regular, 2/6 systolic murmur at the apex. RESPIRATORY:  Clear to auscultation without rales, wheezing or rhonchi  ABDOMEN: Soft, non-tender, non-distended MUSCULOSKELETAL:  No edema; No deformity  SKIN: Warm and dry NEUROLOGIC:  Alert and oriented x 3 PSYCHIATRIC:  Normal affect    Signed, Jennifer JONELLE Crape, MD  12/06/2023 1:39 PM    [facility]

## 2023-12-06 NOTE — Patient Instructions (Addendum)
 Medication Instructions:  Your physician recommends that you continue on your current medications as directed. Please refer to the Current Medication list given to you today.  *If you need a refill on your cardiac medications before your next appointment, please call your pharmacy*   Lab Work: Your physician recommends that you return for lab work in: CMP and lipids You need to have labs done when you are fasting.  You can come Monday through Friday 8:30 am to 12:00 pm and 1:15 to 4:30. You do not need to make an appointment as the order has already been placed.   If you have labs (blood work) drawn today and your tests are completely normal, you will receive your results only by: MyChart Message (if you have MyChart) OR A paper copy in the mail If you have any lab test that is abnormal or we need to change your treatment, we will call you to review the results.   Testing/Procedures: None ordered   Follow-Up: At [facility], you and your health needs are our priority.  As part of our continuing mission to provide you with exceptional heart care, we have created designated Provider Care Teams.  These Care Teams include your primary Cardiologist (physician) and Advanced Practice Providers (APPs -  Physician Assistants and Nurse Practitioners) who all work together to provide you with the care you need, when you need it.  We recommend signing up for the patient portal called "MyChart".  Sign up information is provided on this After Visit Summary.  MyChart is used to connect with patients for Virtual Visits (Telemedicine).  Patients are able to view lab/test results, encounter notes, upcoming appointments, etc.  Non-urgent messages can be sent to your provider as well.   To learn more about what you can do with MyChart, go to [email or web address].    Your next appointment:   12 month(s)  The format for your next appointment:   In Person  Provider:   Belva Crome, MD     Other Instructions none  Important Information About Sugar

## 2023-12-07 ENCOUNTER — Telehealth: Payer: Self-pay | Admitting: Cardiology

## 2023-12-07 DIAGNOSIS — I2510 Atherosclerotic heart disease of native coronary artery without angina pectoris: Secondary | ICD-10-CM | POA: Diagnosis not present

## 2023-12-07 DIAGNOSIS — E782 Mixed hyperlipidemia: Secondary | ICD-10-CM | POA: Diagnosis not present

## 2023-12-07 MED ORDER — NITROGLYCERIN 0.4 MG SL SUBL: 0.4000 mg | SUBLINGUAL_TABLET | SUBLINGUAL | 6 refills | Status: AC | PRN

## 2023-12-07 MED ORDER — ROSUVASTATIN CALCIUM 10 MG PO TABS: 10.0000 mg | ORAL_TABLET | Freq: Every day | ORAL | 3 refills | Status: DC

## 2023-12-07 NOTE — Telephone Encounter (Signed)
RX has been sent and pt aware 

## 2023-12-07 NOTE — Telephone Encounter (Signed)
   Prescription Request  12/07/2023  LOV: 12/06/2023  What is the name of the medication or equipment? nitroglycerin  & rosuvastatin   Have you contacted your pharmacy to request a refill? No   Which pharmacy would you like this sent to?   [facility] [place] - [place], [place] - [address] [place] [address] Phone: [phone] Fax: [phone]   Patient notified that their request is being sent to the clinical staff for review and that they should receive a response within 2 business days.   Please advise at Mobile [phone] (mobile)

## 2023-12-08 LAB — COMPREHENSIVE METABOLIC PANEL
ALT: 18 [IU]/L (ref 0–32)
AST: 15 [IU]/L (ref 0–40)
Albumin: 4.2 g/dL (ref 3.8–4.8)
Alkaline Phosphatase: 76 [IU]/L (ref 44–121)
BUN/Creatinine Ratio: 25 (ref 12–28)
BUN: 19 mg/dL (ref 8–27)
Bilirubin Total: 0.3 mg/dL (ref 0.0–1.2)
CO2: 24 mmol/L (ref 20–29)
Calcium: 9.5 mg/dL (ref 8.7–10.3)
Chloride: 105 mmol/L (ref 96–106)
Creatinine, Ser: 0.77 mg/dL (ref 0.57–1.00)
Globulin, Total: 2.6 g/dL (ref 1.5–4.5)
Glucose: 97 mg/dL (ref 70–99)
Potassium: 4.7 mmol/L (ref 3.5–5.2)
Sodium: 143 mmol/L (ref 134–144)
Total Protein: 6.8 g/dL (ref 6.0–8.5)
eGFR: 82 mL/min/{1.73_m2} (ref >59)

## 2023-12-08 LAB — LIPID PANEL
Chol/HDL Ratio: 2.8 {ratio} (ref 0.0–4.4)
Cholesterol, Total: 150 mg/dL (ref 100–199)
HDL: 54 mg/dL (ref >39)
LDL Chol Calc (NIH): 79 mg/dL (ref 0–99)
Triglycerides: 94 mg/dL (ref 0–149)
VLDL Cholesterol Cal: 17 mg/dL (ref 5–40)

## 2023-12-24 DIAGNOSIS — J479 Bronchiectasis, uncomplicated: Secondary | ICD-10-CM | POA: Diagnosis not present

## 2023-12-24 DIAGNOSIS — J4530 Mild persistent asthma, uncomplicated: Secondary | ICD-10-CM | POA: Diagnosis not present

## 2023-12-24 DIAGNOSIS — Z8616 Personal history of COVID-19: Secondary | ICD-10-CM | POA: Diagnosis not present

## 2023-12-24 DIAGNOSIS — Z87891 Personal history of nicotine dependence: Secondary | ICD-10-CM | POA: Diagnosis not present

## 2023-12-30 DIAGNOSIS — J479 Bronchiectasis, uncomplicated: Secondary | ICD-10-CM | POA: Diagnosis not present

## 2024-01-26 DIAGNOSIS — R911 Solitary pulmonary nodule: Secondary | ICD-10-CM | POA: Diagnosis not present

## 2024-01-26 DIAGNOSIS — Z8616 Personal history of COVID-19: Secondary | ICD-10-CM | POA: Diagnosis not present

## 2024-01-26 DIAGNOSIS — Z87891 Personal history of nicotine dependence: Secondary | ICD-10-CM | POA: Diagnosis not present

## 2024-01-26 DIAGNOSIS — J4530 Mild persistent asthma, uncomplicated: Secondary | ICD-10-CM | POA: Diagnosis not present

## 2024-02-10 ENCOUNTER — Other Ambulatory Visit: Payer: Self-pay | Admitting: Oncology

## 2024-02-10 ENCOUNTER — Inpatient Hospital Stay: Attending: Oncology | Admitting: Oncology

## 2024-02-10 ENCOUNTER — Encounter: Payer: Self-pay | Admitting: Oncology

## 2024-02-10 VITALS — BP 157/61 | HR 69 | Temp 97.6°F | Resp 18 | Ht 60.0 in | Wt 157.9 lb

## 2024-02-10 DIAGNOSIS — Z853 Personal history of malignant neoplasm of breast: Secondary | ICD-10-CM | POA: Diagnosis not present

## 2024-02-10 DIAGNOSIS — Z08 Encounter for follow-up examination after completed treatment for malignant neoplasm: Secondary | ICD-10-CM | POA: Diagnosis not present

## 2024-02-10 DIAGNOSIS — Z170 Estrogen receptor positive status [ER+]: Secondary | ICD-10-CM

## 2024-02-10 DIAGNOSIS — M8589 Other specified disorders of bone density and structure, multiple sites: Secondary | ICD-10-CM | POA: Diagnosis not present

## 2024-02-10 DIAGNOSIS — C50412 Malignant neoplasm of upper-outer quadrant of left female breast: Secondary | ICD-10-CM | POA: Diagnosis not present

## 2024-02-10 DIAGNOSIS — Z9013 Acquired absence of bilateral breasts and nipples: Secondary | ICD-10-CM | POA: Diagnosis not present

## 2024-02-10 NOTE — Progress Notes (Signed)
 [facility]  [address] [place],  [place]  [address] [phone]  Clinic Day:  02/10/24  Referring physician: Marylen Ponto, MD   CHIEF COMPLAINT:  CC: History of stage IIB hormone receptor positive breast cancer  Current Treatment:  Surveillance   HISTORY OF PRESENT ILLNESS:  Sarah Hubbard is a 73 y.o. 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 We began seeing her in March 2016, when she relocated to the area.  She was treated with bilateral mastectomies and reconstruction in [place].  Pathology revealed 2 lesions in the left breast measuring 2.5 cm and 1.1 cm respectively, which were grade 2, invasive lobular carcinoma. 1 of 4 lymph nodes were positive.  Pathology of the right breast was benign.  Estrogen receptors were positive, progesterone receptors negative and her 2 Neu negative.  She had surgical wound dehiscence of the reconstruction and abdominal wounds, but eventually healed.  She received adjuvant chemotherapy with dose dense doxorubicin and cyclophosphamide for 4 cycles, followed by dose dense paclitaxel for 4 cycles.  Chemotherapy was completed in August 2015.  She received adjuvant radiation to the left reconstruction and axilla completed in October 2015.  She was then placed on anastrozole 1 mg daily in November 2015.  Due to her personal and family history she states she saw a genetic counselor regarding testing for hereditary breast and ovarian cancer.  Her mother had breast cancer in her 68's and a maternal grandmother had lung cancer.  As the patient's daughter had already undergone testing for hereditary breast and ovarian cancer, which was negative, the patient did not wish to have testing.  Bone density scan was in October 2017 and revealed osteopenia, with a T-score of -1.8 of the femur and -0.7 of the spine.  The bone density had previously been normal with a T-score of -0.6  of the femur.  Due to the osteopenia while on anastrozole, she was instructed to take calcium/vitamin-D twice daily and placed on Prolia injections every 6 months in January 2018.  She had removal of polyps on previous colonoscopy, so Dr. Leonor Liv had her see Dr. Charm Barges.  He recommended repeat colonoscopy in 5 years.  Bone density scan in October 2019 revealed improvement in her osteopenia with a T-score of -0.9 in the spine and a T-score -1.6 in the femur.  At her visit in January 2020, she was concerned about a tender lump under her left breast reconstruction.  There was some firmness of the inferior left reconstruction.  She therefore underwent a left diagnostic mammogram and ultrasound, which did not reveal any abnormality.  She stopped her anastrozole in January 2021.  Bone density scan from October 2021 revealed osteopenia with a T-score of -1.6 of the right femur neck, stable.  Dual femur total mean is normal at -1.0, previously -1.1.  AP spine is normal at -0.5, previously -0.9.  With this good improvement, Prolia was discontinued in January 2022.  INTERVAL HISTORY:  Sarah Hubbard is here for annual follow up for her history of stage IIB hormone receptor positive breast cancer in February, 2015. Patient states that she feels well but complains of left underarm discomfort where she has scar tissue. She recently seen pulmonology and her pulmonary function test was abnormal with a FEV1 of 1 liter. She appears to have both asthma and emphysema. She continues to carry her inhaler with her. She had a CT chest done on 12/30/2023  that revealed no acute cardiopulmonary disease and a 2 mm right upper lobe pulmonary nodule. She was very nervous about this pulmonary nodule and I tried to reassure her based on the size and the fact that these nodules are quite common and usually benign. During physical exam I palpated a hard area in the inferior right breast along the scar with a 1 cm nodule in the center of the scar and  recommended an ultrasound. I will order an ultrasound of the inferior right breast and left axilla at the scar. I will call her with those results. I will see her back sooner if there is any concern about the ultrasound. If all is well, I will see her back in 1 year for reevaluation. She denies signs of infection such as sore throat, sinus drainage, cough, or urinary symptoms.  She denies fevers or recurrent chills. She denies pain. She denies nausea, vomiting, chest pain, or cough. Her appetite is good and her weight has increased 1 pounds over last 2 months .   REVIEW OF SYSTEMS:  Review of Systems  Constitutional: Negative.  Negative for appetite change, chills, diaphoresis, fatigue, fever and unexpected weight change.  HENT:  Negative.  Negative for hearing loss, lump/mass, mouth sores, nosebleeds, sore throat, tinnitus, trouble swallowing and voice change.   Eyes: Negative.  Negative for eye problems and icterus.  Respiratory:  Positive for shortness of breath. Negative for chest tightness, cough, hemoptysis and wheezing.   Cardiovascular: Negative.  Negative for chest pain, leg swelling and palpitations.  Gastrointestinal: Negative.  Negative for abdominal distention, abdominal pain, blood in stool, constipation, diarrhea, nausea, rectal pain and vomiting.  Endocrine: Negative.  Negative for hot flashes.  Genitourinary: Negative.  Negative for bladder incontinence, difficulty urinating, dyspareunia, dysuria, frequency, hematuria, menstrual problem, nocturia, pelvic pain, vaginal bleeding and vaginal discharge.   Musculoskeletal: Negative.  Negative for arthralgias, back pain, flank pain, gait problem, myalgias, neck pain and neck stiffness.       Left underarm discomfort  Skin: Negative.  Negative for itching, rash and wound.  Neurological: Negative.  Negative for dizziness, extremity weakness, gait problem, headaches, light-headedness, numbness, seizures and speech difficulty.  Hematological:  Negative.  Negative for adenopathy. Does not bruise/bleed easily.  Psychiatric/Behavioral: Negative.  Negative for confusion, decreased concentration, depression, sleep disturbance and suicidal ideas. The patient is not nervous/anxious.      VITALS:  Blood pressure (!) 157/61, pulse 69, temperature 97.6 F (36.4 C), temperature source Oral, resp. rate 18, height 5' (1.524 m), weight 157 lb 14.4 oz (71.6 kg).  Wt Readings from Last 3 Encounters:  02/10/24 157 lb 14.4 oz (71.6 kg)  12/06/23 156 lb 3.2 oz (70.9 kg)  12/07/22 158 lb 3.2 oz (71.8 kg)    Body mass index is 30.84 kg/m.  Performance status (ECOG): 1 - Symptomatic but completely ambulatory  PHYSICAL EXAM:  Physical Exam Vitals and nursing note reviewed.  Constitutional:      General: She is not in acute distress.    Appearance: Normal appearance. She is normal weight. She is not ill-appearing, toxic-appearing or diaphoretic.  HENT:     Head: Normocephalic and atraumatic.     Right Ear: Tympanic membrane, ear canal and external ear normal. There is no impacted cerumen.     Left Ear: Tympanic membrane, ear canal and external ear normal. There is no impacted cerumen.     Nose: Nose normal. No congestion or rhinorrhea.     Mouth/Throat:     Mouth:  Mucous membranes are moist.     Pharynx: Oropharynx is clear. No oropharyngeal exudate or posterior oropharyngeal erythema.  Eyes:     General: No scleral icterus.       Right eye: No discharge.        Left eye: No discharge.     Extraocular Movements: Extraocular movements intact.     Conjunctiva/sclera: Conjunctivae normal.     Pupils: Pupils are equal, round, and reactive to light.  Neck:     Vascular: No carotid bruit.  Cardiovascular:     Rate and Rhythm: Normal rate and regular rhythm.     Pulses: Normal pulses.     Heart sounds: Normal heart sounds. No murmur heard.    No friction rub. No gallop.  Pulmonary:     Effort: Pulmonary effort is normal. No respiratory  distress.     Breath sounds: Normal breath sounds. No stridor. No wheezing, rhonchi or rales.  Chest:     Chest wall: No tenderness.     Comments: Bilateral reconstructions are negative Scar tissue in the left axilla that feels firm but benign Hard area in the inferior right breast along the scar with a 1cm nodule in the center of the scar.  Abdominal:     General: Bowel sounds are normal. There is no distension.     Palpations: Abdomen is soft. There is no hepatomegaly, splenomegaly or mass.     Tenderness: There is no abdominal tenderness. There is no right CVA tenderness, left CVA tenderness, guarding or rebound.     Hernia: No hernia is present.  Musculoskeletal:        General: No swelling, tenderness, deformity or signs of injury. Normal range of motion.     Cervical back: Normal range of motion and neck supple. No rigidity or tenderness.     Right lower leg: No edema.     Left lower leg: No edema.  Lymphadenopathy:     Cervical: No cervical adenopathy.  Skin:    General: Skin is warm and dry.     Coloration: Skin is not jaundiced or pale.     Findings: No bruising, erythema, lesion or rash.  Neurological:     General: No focal deficit present.     Mental Status: She is alert and oriented to person, place, and time. Mental status is at baseline.     Cranial Nerves: No cranial nerve deficit.     Sensory: No sensory deficit.     Motor: No weakness.     Coordination: Coordination normal.     Gait: Gait normal.     Deep Tendon Reflexes: Reflexes normal.  Psychiatric:        Mood and Affect: Mood normal.        Behavior: Behavior normal.        Thought Content: Thought content normal.        Judgment: Judgment normal.    LABS:      Latest Ref Rng & Units 10/07/2022   12:55 PM 11/12/2020   12:00 AM  CBC  WBC  7.7     6.8  C     Hemoglobin 12.0 - 16.0 14.0     13.8      Hematocrit 36 - 46 40     41      Platelets 150 - 400 K/uL 300     265  C       C Corrected result    This result is from an external  source.      Latest Ref Rng & Units 12/07/2023    8:25 AM 12/07/2022    1:31 PM 10/07/2022   12:55 PM  CMP  Glucose 70 - 99 mg/dL 97  086    BUN 8 - 27 mg/dL 19  11  15       Creatinine 0.57 - 1.00 mg/dL 5.78  4.69  0.7      Sodium 134 - 144 mmol/L 143  145  140      Potassium 3.5 - 5.2 mmol/L 4.7  4.1  4.3      Chloride 96 - 106 mmol/L 105  106  103      CO2 20 - 29 mmol/L 24  24  27       Calcium 8.7 - 10.3 mg/dL 9.5  9.7  9.8      Total Protein 6.0 - 8.5 g/dL 6.8  6.7    Total Bilirubin 0.0 - 1.2 mg/dL 0.3  <6.2    Alkaline Phos 44 - 121 IU/L 76  76  67      AST 0 - 40 IU/L 15  17  14       ALT 0 - 32 IU/L 18  17  13          This result is from an external source.   STUDIES:  No studies found.   Allergies:  Allergies  Allergen Reactions   Antihistamines, Diphenhydramine-Type    Codeine     Upset stomach    Current Medications: Current Outpatient Medications  Medication Sig Dispense Refill   Albuterol-Budesonide (AIRSUPRA) 90-80 MCG/ACT AERO Inhale into the lungs.     aspirin EC 81 MG tablet Take 81 mg by mouth daily. Swallow whole.     BREYNA 160-4.5 MCG/ACT inhaler Inhale into the lungs.     calcium-vitamin D (OSCAL WITH D) 500-200 MG-UNIT tablet Take 1 tablet by mouth daily.     Cholecalciferol 50 MCG (2000 UT) CAPS Take 2,000 Units by mouth daily.     losartan (COZAAR) 25 MG tablet Take 25 mg by mouth daily.     montelukast (SINGULAIR) 10 MG tablet Take 10 mg by mouth at bedtime.     nitroGLYCERIN (NITROSTAT) 0.4 MG SL tablet Place 1 tablet (0.4 mg total) under the tongue every 5 (five) minutes as needed for chest pain. 25 tablet 6   Omega-3 1000 MG CAPS Take 1,000 mg by mouth daily.     rosuvastatin (CRESTOR) 10 MG tablet Take 1 tablet (10 mg total) by mouth daily. 90 tablet 3   tolterodine (DETROL LA) 4 MG 24 hr capsule Take 4 mg by mouth daily.     No current facility-administered medications for this visit.     ASSESSMENT  & PLAN:  Assessment:   1. History of IIB hormone receptor positive breast cancer, over 10 years ago.  She remains without evidence of recurrence.  She completed 5+ years of anastrozole in January 2021.  2. Osteopenia, for which she was on Prolia every 6 months, in addition to calcium and vitamin-D.  Bone density from October of 2021 showed good improvement so we discontinued her Prolia by January of 2022. She knows to continue with daily calcium and vitamin D.    3. Bilateral Mastectomies with Reconstruction, I suspect the hard nodule that I feel in the inferior right may be fat necrosis but I feel it is appropriate to evaluate it further. I would also like to evaluate the hard scar in the left  axilla since it does cause her discomfort.   Plan: She recently seen pulmonology and her pulmonary function test was abnormal with a FEV1 of 1 liter. She appears to have both asthma and emphysema. She continues to carry her inhaler with her. She had a CT chest done on 12/30/2023 that revealed no acute cardiopulmonary disease and a 2 mm right upper lobe pulmonary nodule. She was very nervous about this pulmonary nodule and I tried to reassure her based on the size and the fact that these nodules are quite common and usually benign. During physical exam I palpated a hard area in the inferior right breast along the scar with a 1 cm nodule in the center of the scar and recommended a ultrasound. I will order an ultrasound of the inferior right breast and left axilla at the scar. I will call her with those results. I will see her back sooner if there is any concern about the ultrasound. If all is well, I will see her back in 1 year for reevaluation. The patient understands the plans discussed today and is in agreement with them.  She knows to contact our office if she develops concerns regarding her breast cancer or its treatment.  I provided 20 minutes of face-to-face time during this this encounter and > 50% was spent  counseling as documented under my assessment and plan.   Sarah Beckwith, MD [facility] DE[facility] OF [facility] [address] [place] [address] Dept: [phone] Dept Fax: [phone]   No orders of the defined types were placed in this encounter.  I,Jasmine M Lassiter,acting as a scribe for Sarah Beckwith, MD.,have documented all relevant documentation on the behalf of Sarah Beckwith, MD,as directed by  Sarah Beckwith, MD while in the presence of Sarah Beckwith, MD.

## 2024-02-11 ENCOUNTER — Telehealth: Payer: Self-pay | Admitting: Oncology

## 2024-02-11 NOTE — Telephone Encounter (Signed)
 02/11/24 LVM upcoming appt for Bilat Breast u/s on 03/30/24@1045 -[facility]

## 2024-02-23 ENCOUNTER — Encounter: Payer: Self-pay | Admitting: Oncology

## 2024-03-01 DIAGNOSIS — Z6828 Body mass index (BMI) 28.0-28.9, adult: Secondary | ICD-10-CM | POA: Diagnosis not present

## 2024-03-01 DIAGNOSIS — Z87891 Personal history of nicotine dependence: Secondary | ICD-10-CM | POA: Diagnosis not present

## 2024-03-01 DIAGNOSIS — Z8616 Personal history of COVID-19: Secondary | ICD-10-CM | POA: Diagnosis not present

## 2024-03-01 DIAGNOSIS — J4530 Mild persistent asthma, uncomplicated: Secondary | ICD-10-CM | POA: Diagnosis not present

## 2024-03-01 DIAGNOSIS — N898 Other specified noninflammatory disorders of vagina: Secondary | ICD-10-CM | POA: Diagnosis not present

## 2024-03-01 DIAGNOSIS — R300 Dysuria: Secondary | ICD-10-CM | POA: Diagnosis not present

## 2024-03-01 DIAGNOSIS — R911 Solitary pulmonary nodule: Secondary | ICD-10-CM | POA: Diagnosis not present

## 2024-03-07 DIAGNOSIS — R0789 Other chest pain: Secondary | ICD-10-CM | POA: Diagnosis not present

## 2024-03-07 DIAGNOSIS — J4530 Mild persistent asthma, uncomplicated: Secondary | ICD-10-CM | POA: Diagnosis not present

## 2024-03-30 ENCOUNTER — Ambulatory Visit
Admission: RE | Admit: 2024-03-30 | Discharge: 2024-03-30 | Disposition: A | Discharge status: Home / Self Care | Source: Ambulatory Visit | Attending: Oncology | Admitting: Oncology

## 2024-03-30 DIAGNOSIS — C50412 Malignant neoplasm of upper-outer quadrant of left female breast: Secondary | ICD-10-CM

## 2024-03-30 DIAGNOSIS — Z853 Personal history of malignant neoplasm of breast: Secondary | ICD-10-CM | POA: Diagnosis not present

## 2024-03-30 DIAGNOSIS — N6314 Unspecified lump in the right breast, lower inner quadrant: Secondary | ICD-10-CM | POA: Diagnosis not present

## 2024-03-30 DIAGNOSIS — N6332 Unspecified lump in axillary tail of the left breast: Secondary | ICD-10-CM | POA: Diagnosis not present

## 2024-03-30 DIAGNOSIS — Z170 Estrogen receptor positive status [ER+]: Secondary | ICD-10-CM

## 2024-04-10 ENCOUNTER — Telehealth: Payer: Self-pay

## 2024-04-10 NOTE — Telephone Encounter (Signed)
-----   Message from Nolia Baumgartner sent at 04/06/2024  7:54 PM EDT ----- Regarding: call Tell her the ultrasound is all clear so I can simply see her back in 1 year for reexamination.  I think this area is scar tissue.  Please let the schedulers know to schedule her in mid March and I will not need any tests.

## 2024-04-10 NOTE — Telephone Encounter (Signed)
 Attempted to contact patient. No answer.

## 2024-04-12 ENCOUNTER — Telehealth: Payer: Self-pay | Admitting: Oncology

## 2024-04-12 NOTE — Telephone Encounter (Signed)
 Patient has been scheduled. Aware of appt date and time.      call Received: 6 days ago Nolia Baumgartner, MD  Earma Gloss, LPN; Figueroa, Damaris; Luevenia Saha Tell her the ultrasound is all clear so I can simply see her back in 1 year for reexamination.  I think this area is scar tissue.  Please let the schedulers know to schedule her in mid March and I will not need any tests.       Comments  04/12/24 LVM to schedule 78yr f/u appt.

## 2024-04-18 DIAGNOSIS — N814 Uterovaginal prolapse, unspecified: Secondary | ICD-10-CM | POA: Diagnosis not present

## 2024-04-18 DIAGNOSIS — R102 Pelvic and perineal pain: Secondary | ICD-10-CM | POA: Diagnosis not present

## 2024-05-05 DIAGNOSIS — R102 Pelvic and perineal pain: Secondary | ICD-10-CM | POA: Diagnosis not present

## 2024-05-08 DIAGNOSIS — N814 Uterovaginal prolapse, unspecified: Secondary | ICD-10-CM | POA: Diagnosis not present

## 2024-05-08 DIAGNOSIS — R102 Pelvic and perineal pain: Secondary | ICD-10-CM | POA: Diagnosis not present

## 2024-05-30 DIAGNOSIS — R82998 Other abnormal findings in urine: Secondary | ICD-10-CM | POA: Diagnosis not present

## 2024-05-30 DIAGNOSIS — R5383 Other fatigue: Secondary | ICD-10-CM | POA: Diagnosis not present

## 2024-05-30 DIAGNOSIS — R5382 Chronic fatigue, unspecified: Secondary | ICD-10-CM | POA: Diagnosis not present

## 2024-05-30 DIAGNOSIS — J439 Emphysema, unspecified: Secondary | ICD-10-CM | POA: Diagnosis not present

## 2024-05-30 DIAGNOSIS — E538 Deficiency of other specified B group vitamins: Secondary | ICD-10-CM | POA: Diagnosis not present

## 2024-05-30 DIAGNOSIS — D638 Anemia in other chronic diseases classified elsewhere: Secondary | ICD-10-CM | POA: Diagnosis not present

## 2024-05-30 DIAGNOSIS — R911 Solitary pulmonary nodule: Secondary | ICD-10-CM | POA: Diagnosis not present

## 2024-05-30 DIAGNOSIS — J4530 Mild persistent asthma, uncomplicated: Secondary | ICD-10-CM | POA: Diagnosis not present

## 2024-05-30 DIAGNOSIS — Z8616 Personal history of COVID-19: Secondary | ICD-10-CM | POA: Diagnosis not present

## 2024-05-30 DIAGNOSIS — E559 Vitamin D deficiency, unspecified: Secondary | ICD-10-CM | POA: Diagnosis not present

## 2024-06-19 DIAGNOSIS — R339 Retention of urine, unspecified: Secondary | ICD-10-CM | POA: Diagnosis not present

## 2024-06-19 DIAGNOSIS — N859 Noninflammatory disorder of uterus, unspecified: Secondary | ICD-10-CM | POA: Diagnosis not present

## 2024-06-19 DIAGNOSIS — R102 Pelvic and perineal pain: Secondary | ICD-10-CM | POA: Diagnosis not present

## 2024-06-23 DIAGNOSIS — I10 Essential (primary) hypertension: Secondary | ICD-10-CM | POA: Diagnosis not present

## 2024-06-23 DIAGNOSIS — N859 Noninflammatory disorder of uterus, unspecified: Secondary | ICD-10-CM | POA: Diagnosis not present

## 2024-06-23 DIAGNOSIS — N854 Malposition of uterus: Secondary | ICD-10-CM | POA: Diagnosis not present

## 2024-06-23 DIAGNOSIS — Z853 Personal history of malignant neoplasm of breast: Secondary | ICD-10-CM | POA: Diagnosis not present

## 2024-06-23 DIAGNOSIS — E785 Hyperlipidemia, unspecified: Secondary | ICD-10-CM | POA: Diagnosis not present

## 2024-06-23 DIAGNOSIS — Z7982 Long term (current) use of aspirin: Secondary | ICD-10-CM | POA: Diagnosis not present

## 2024-06-23 DIAGNOSIS — Z79899 Other long term (current) drug therapy: Secondary | ICD-10-CM | POA: Diagnosis not present

## 2024-06-23 DIAGNOSIS — R339 Retention of urine, unspecified: Secondary | ICD-10-CM | POA: Diagnosis not present

## 2024-06-23 DIAGNOSIS — S3769XA Other injury of uterus, initial encounter: Secondary | ICD-10-CM | POA: Diagnosis not present

## 2024-06-23 DIAGNOSIS — J45909 Unspecified asthma, uncomplicated: Secondary | ICD-10-CM | POA: Diagnosis not present

## 2024-06-28 DIAGNOSIS — R911 Solitary pulmonary nodule: Secondary | ICD-10-CM | POA: Diagnosis not present

## 2024-06-28 DIAGNOSIS — J439 Emphysema, unspecified: Secondary | ICD-10-CM | POA: Diagnosis not present

## 2024-06-28 DIAGNOSIS — Z8616 Personal history of COVID-19: Secondary | ICD-10-CM | POA: Diagnosis not present

## 2024-06-28 DIAGNOSIS — J4530 Mild persistent asthma, uncomplicated: Secondary | ICD-10-CM | POA: Diagnosis not present

## 2024-07-03 DIAGNOSIS — N814 Uterovaginal prolapse, unspecified: Secondary | ICD-10-CM | POA: Diagnosis not present

## 2024-07-03 DIAGNOSIS — R339 Retention of urine, unspecified: Secondary | ICD-10-CM | POA: Diagnosis not present

## 2024-07-03 DIAGNOSIS — R102 Pelvic and perineal pain: Secondary | ICD-10-CM | POA: Diagnosis not present

## 2024-07-10 DIAGNOSIS — Z961 Presence of intraocular lens: Secondary | ICD-10-CM | POA: Diagnosis not present

## 2024-07-11 DIAGNOSIS — R7989 Other specified abnormal findings of blood chemistry: Secondary | ICD-10-CM | POA: Diagnosis not present

## 2024-09-15 ENCOUNTER — Other Ambulatory Visit: Payer: Self-pay | Admitting: Cardiology

## 2024-09-21 DIAGNOSIS — N812 Incomplete uterovaginal prolapse: Secondary | ICD-10-CM | POA: Diagnosis not present

## 2024-09-21 DIAGNOSIS — R339 Retention of urine, unspecified: Secondary | ICD-10-CM | POA: Diagnosis not present

## 2024-10-02 DIAGNOSIS — Z87891 Personal history of nicotine dependence: Secondary | ICD-10-CM | POA: Diagnosis not present

## 2024-10-02 DIAGNOSIS — R911 Solitary pulmonary nodule: Secondary | ICD-10-CM | POA: Diagnosis not present

## 2024-10-02 DIAGNOSIS — J439 Emphysema, unspecified: Secondary | ICD-10-CM | POA: Diagnosis not present

## 2024-10-02 DIAGNOSIS — J4530 Mild persistent asthma, uncomplicated: Secondary | ICD-10-CM | POA: Diagnosis not present

## 2024-10-17 DIAGNOSIS — E785 Hyperlipidemia, unspecified: Secondary | ICD-10-CM | POA: Diagnosis not present

## 2024-10-17 DIAGNOSIS — E041 Nontoxic single thyroid nodule: Secondary | ICD-10-CM | POA: Diagnosis not present

## 2024-10-17 DIAGNOSIS — Z1339 Encounter for screening examination for other mental health and behavioral disorders: Secondary | ICD-10-CM | POA: Diagnosis not present

## 2024-10-17 DIAGNOSIS — Z0000 Encounter for general adult medical examination without abnormal findings: Secondary | ICD-10-CM | POA: Diagnosis not present

## 2024-10-17 DIAGNOSIS — Z79899 Other long term (current) drug therapy: Secondary | ICD-10-CM | POA: Diagnosis not present

## 2024-10-17 DIAGNOSIS — Z6828 Body mass index (BMI) 28.0-28.9, adult: Secondary | ICD-10-CM | POA: Diagnosis not present

## 2024-10-23 DIAGNOSIS — R339 Retention of urine, unspecified: Secondary | ICD-10-CM | POA: Diagnosis not present

## 2024-12-05 DIAGNOSIS — J45909 Unspecified asthma, uncomplicated: Secondary | ICD-10-CM | POA: Insufficient documentation

## 2024-12-13 ENCOUNTER — Ambulatory Visit: Attending: Cardiology | Admitting: Cardiology

## 2024-12-13 ENCOUNTER — Encounter: Payer: Self-pay | Admitting: Cardiology

## 2024-12-13 VITALS — BP 126/60 | HR 79 | Ht 60.0 in | Wt 159.8 lb

## 2024-12-13 DIAGNOSIS — E66811 Obesity, class 1: Secondary | ICD-10-CM | POA: Diagnosis not present

## 2024-12-13 DIAGNOSIS — I10 Essential (primary) hypertension: Secondary | ICD-10-CM | POA: Diagnosis not present

## 2024-12-13 DIAGNOSIS — I2510 Atherosclerotic heart disease of native coronary artery without angina pectoris: Secondary | ICD-10-CM | POA: Diagnosis not present

## 2024-12-13 DIAGNOSIS — E782 Mixed hyperlipidemia: Secondary | ICD-10-CM | POA: Insufficient documentation

## 2024-12-13 NOTE — Progress Notes (Signed)
 " Cardiology Office Note:    Date:  12/13/2024   ID:  Sarah Hubbard, DOB Dec 29, 1950, MRN [id number]  PCP:  Ina Marcellus RAMAN, MD  Cardiologist:  Jennifer JONELLE Crape, MD   Referring MD: Ina Marcellus RAMAN, MD    ASSESSMENT:    1. Mixed hyperlipidemia   2. Atherosclerosis of native coronary artery of native heart without angina pectoris   3. Essential hypertension   4. Mixed dyslipidemia   5. Obesity (BMI 30.0-34.9)    PLAN:    In order of problems listed above:  Coronary artery calcifications:Primary prevention stressed with the patient.  Importance of compliance with diet medication stressed and patient verbalized standing. Patient was advised to walk at least half an hour a day on a daily basis. Her effort tolerance is excellent and she is planning to undergo uterine GYN surgery.  She is not at high risk based on the history she provided to me.  She was advised to walk at least half an hour a day daily. Essential hypertension: Blood pressure stable and diet was emphasized. Mixed dyslipidemia: On lipid-lowering medications followed by primary care.  Goal LDL less than 60. Obesity: Weight reduction stressed diet emphasized and she promises to do better.  Risks of obesity explained  Patient will be seen in follow-up appointment in 6 months or earlier if the patient has any concerns.    Medication Adjustments/Labs and Tests Ordered: Current medicines are reviewed at length with the patient today.  Concerns regarding medicines are outlined above.  Orders Placed This Encounter  Procedures   EKG 12-Lead   No orders of the defined types were placed in this encounter.    No chief complaint on file.    History of Present Illness:    Sarah Hubbard is a 74 y.o. female.  Patient has a past medical history of coronary atherosclerosis and aortic atherosclerosis.  She has history of essential hypertension, mixed dyslipidemia and obesity.  She is an active lady.  She tells me  that she can walk half an hour a day without any problems.  At the time of my evaluation, the patient is alert awake oriented and in no distress.  Past Medical History:  Diagnosis Date   Angina pectoris 05/07/2021   Asthma    BMI 28.0-28.9,adult    Bruit    Chest pressure    Coronary atherosclerosis 12/12/2021   Essential hypertension 05/07/2021   Fatty liver    Hearing loss    Hyperlipidemia    Hypertension    Malignant neoplasm of upper-outer quadrant of left female breast (HCC)    Mixed dyslipidemia 05/07/2021   Obesity (BMI 30.0-34.9) 12/07/2022   Osteoarthritis of knee 05/11/2018   Osteopenia after menopause 12/01/2021   Prolia  stopped in 2022   Other specified disorders of bone density and structure, multiple sites 11/01/2020   Palpitations    Thyroid  nodule    Trochanteric bursitis of left hip 05/31/2018    Past Surgical History:  Procedure Laterality Date   BREAST RECONSTRUCTION  2015   LEEP     X2   MASTECTOMY     TUBAL LIGATION  1979    Current Medications: Active Medications[1]   Allergies:   Antihistamines, diphenhydramine-type and Codeine   Social History   Socioeconomic History   Marital status: Divorced    Spouse name: Not on file   Number of children: Not on file   Years of education: Not on file   Highest education level: Not on  file  Occupational History   Not on file  Tobacco Use   Smoking status: Former    Current packs/day: 2.00    Average packs/day: 2.0 packs/day for 32.0 years (64.0 ttl pk-yrs)    Types: Cigarettes   Smokeless tobacco: Never   Tobacco comments:    quit 2003  Substance and Sexual Activity   Alcohol use: Not Currently   Drug use: Never   Sexual activity: Not Currently  Other Topics Concern   Not on file  Social History Narrative   Not on file   Social Drivers of Health   Tobacco Use: Medium Risk (12/13/2024)   Patient History    Smoking Tobacco Use: Former    Smokeless Tobacco Use: Never    Passive  Exposure: Not on Actuary Strain: Not on file  Food Insecurity: Not on file  Transportation Needs: Not on file  Physical Activity: Not on file  Stress: Not on file  Social Connections: Not on file  Depression (EYV7-0): Not on file  Alcohol Screen: Not on file  Housing: Not on file  Utilities: Not on file  Health Literacy: Not on file     Family History: The patient's family history includes Bone cancer (age of onset: 19) in her half-sister; Breast cancer in her mother; Kidney cancer in her half-brother; Mesothelioma in her father.  ROS:   Please see the history of present illness.    All other systems reviewed and are negative.  EKGs/Labs/Other Studies Reviewed:    The following studies were reviewed today: .SABRAEKG Interpretation Date/Time:  Wednesday December 13 2024 12:53:28 EST Ventricular Rate:  79 PR Interval:  180 QRS Duration:  68 QT Interval:  360 QTC Calculation: 412 R Axis:   67  Text Interpretation: Normal sinus rhythm Normal ECG No previous ECGs available Confirmed by Edwyna Backers [phone]) on 12/13/2024 1:17:35 PM     Recent Labs: No results found for requested labs within last 365 days.  Recent Lipid Panel    Component Value Date/Time   CHOL 150 12/07/2023 0825   TRIG 94 12/07/2023 0825   HDL 54 12/07/2023 0825   CHOLHDL 2.8 12/07/2023 0825   LDLCALC 79 12/07/2023 0825    Physical Exam:    VS:  BP 126/60   Pulse 79   Ht 5' (1.524 m)   Wt 159 lb 12.8 oz (72.5 kg)   SpO2 95%   BMI 31.21 kg/m     Wt Readings from Last 3 Encounters:  12/13/24 159 lb 12.8 oz (72.5 kg)  02/10/24 157 lb 14.4 oz (71.6 kg)  12/06/23 156 lb 3.2 oz (70.9 kg)     GEN: Patient is in no acute distress HEENT: Normal NECK: No JVD; No carotid bruits LYMPHATICS: No lymphadenopathy CARDIAC: Hear sounds regular, 2/6 systolic murmur at the apex. RESPIRATORY:  Clear to auscultation without rales, wheezing or rhonchi  ABDOMEN: Soft, non-tender,  non-distended MUSCULOSKELETAL:  No edema; No deformity  SKIN: Warm and dry NEUROLOGIC:  Alert and oriented x 3 PSYCHIATRIC:  Normal affect   Signed, Backers JONELLE Edwyna, MD  12/13/2024 1:19 PM    [facility]     [1]  Current Meds  Medication Sig   Albuterol -Budesonide (AIRSUPRA) 90-80 MCG/ACT AERO Inhale 1 puff into the lungs as needed (shortness of breath).   aspirin EC 81 MG tablet Take 81 mg by mouth daily. Swallow whole.   BREYNA 160-4.5 MCG/ACT inhaler Inhale 1 puff into the lungs 2 (two) times  daily.   calcium -vitamin D (OSCAL WITH D) 500-200 MG-UNIT tablet Take 1 tablet by mouth daily.   Cholecalciferol 50 MCG (2000 UT) CAPS Take 2,000 Units by mouth daily.   losartan (COZAAR) 25 MG tablet Take 25 mg by mouth daily.   montelukast (SINGULAIR) 10 MG tablet Take 10 mg by mouth at bedtime.   nitroGLYCERIN  (NITROSTAT ) 0.4 MG SL tablet Place 1 tablet (0.4 mg total) under the tongue every 5 (five) minutes as needed for chest pain.   Omega-3 1000 MG CAPS Take 1,000 mg by mouth daily.   rosuvastatin  (CRESTOR ) 10 MG tablet Take 1 tablet (10 mg total) by mouth daily.   tolterodine (DETROL LA) 4 MG 24 hr capsule Take 4 mg by mouth daily.   "

## 2024-12-13 NOTE — Patient Instructions (Signed)

## 2025-02-09 ENCOUNTER — Ambulatory Visit: Admitting: Oncology
